# Patient Record
Sex: Female | Born: 1965 | Race: Black or African American | Hispanic: No | Marital: Single | Smoking: Former smoker
Health system: Southern US, Community
[De-identification: ages and names within clinical notes are randomized; demographics above are authoritative.]

## PROBLEM LIST (undated history)

## (undated) DIAGNOSIS — E079 Disorder of thyroid, unspecified: Secondary | ICD-10-CM

## (undated) DIAGNOSIS — C801 Malignant (primary) neoplasm, unspecified: Secondary | ICD-10-CM

## (undated) HISTORY — DX: Malignant (primary) neoplasm, unspecified: C80.1

## (undated) HISTORY — DX: Disorder of thyroid, unspecified: E07.9

## (undated) HISTORY — PX: THYROIDECTOMY: SHX17

## (undated) HISTORY — PX: OTHER SURGICAL HISTORY: SHX169

---

## 2011-05-26 ENCOUNTER — Ambulatory Visit: Payer: Self-pay | Admitting: Internal Medicine

## 2011-07-07 ENCOUNTER — Ambulatory Visit: Payer: Self-pay | Admitting: Internal Medicine

## 2011-08-14 ENCOUNTER — Ambulatory Visit (INDEPENDENT_AMBULATORY_CARE_PROVIDER_SITE_OTHER): Payer: BC Managed Care – PPO | Admitting: Internal Medicine

## 2011-08-14 ENCOUNTER — Encounter: Payer: Self-pay | Admitting: Internal Medicine

## 2011-08-14 ENCOUNTER — Other Ambulatory Visit (INDEPENDENT_AMBULATORY_CARE_PROVIDER_SITE_OTHER): Payer: BC Managed Care – PPO

## 2011-08-14 DIAGNOSIS — C801 Malignant (primary) neoplasm, unspecified: Secondary | ICD-10-CM

## 2011-08-14 DIAGNOSIS — Z0000 Encounter for general adult medical examination without abnormal findings: Secondary | ICD-10-CM

## 2011-08-14 DIAGNOSIS — C73 Malignant neoplasm of thyroid gland: Secondary | ICD-10-CM | POA: Insufficient documentation

## 2011-08-14 LAB — CBC WITH DIFFERENTIAL/PLATELET
Basophils Absolute: 0 10*3/uL (ref 0.0–0.1)
Basophils Relative: 0.7 % (ref 0.0–3.0)
Eosinophils Relative: 1.5 % (ref 0.0–5.0)
HCT: 39 % (ref 36.0–46.0)
Hemoglobin: 12.9 g/dL (ref 12.0–15.0)
Lymphocytes Relative: 22 % (ref 12.0–46.0)
Lymphs Abs: 1 10*3/uL (ref 0.7–4.0)
Monocytes Relative: 7.3 % (ref 3.0–12.0)
Neutro Abs: 3.1 10*3/uL (ref 1.4–7.7)
RBC: 4.02 Mil/uL (ref 3.87–5.11)
RDW: 14.5 % (ref 11.5–14.6)
WBC: 4.5 10*3/uL (ref 4.5–10.5)

## 2011-08-14 LAB — LIPID PANEL
Cholesterol: 198 mg/dL (ref 0–200)
HDL: 62 mg/dL (ref >39.00)
Total CHOL/HDL Ratio: 3
Triglycerides: 103 mg/dL (ref 0.0–149.0)

## 2011-08-14 LAB — COMPREHENSIVE METABOLIC PANEL
ALT: 12 U/L (ref 0–35)
BUN: 11 mg/dL (ref 6–23)
CO2: 25 mEq/L (ref 19–32)
Calcium: 9.1 mg/dL (ref 8.4–10.5)
Chloride: 107 mEq/L (ref 96–112)
Creatinine, Ser: 1 mg/dL (ref 0.4–1.2)
GFR: 63.45 mL/min (ref >60.00)
Total Bilirubin: 0.7 mg/dL (ref 0.3–1.2)

## 2011-08-14 MED ORDER — LEVOTHYROXINE SODIUM 175 MCG PO TABS: 175.0000 ug | ORAL_TABLET | Freq: Every day | ORAL | Status: DC

## 2011-08-14 NOTE — Progress Notes (Signed)
  Subjective:    Patient ID: Shelly Baldwin, female    DOB: Jun 29, 1966, 45 y.o.   MRN: [id number]  HPI Shelly Baldwin presents to establish for continuity care. She has moved to [place] from [place]. Her concern today is to have a refill on her thyroid replacement - s/p thyroidectomy for malignancy greater than 4 years ago. She is also interested in follow-up screening re: r/o recurrence. She has been feeling well. She has joined [employer].  Past Medical History  Diagnosis Date  . Cancer     undifferentiated thryoid  . Routine general medical examination at a health care facility    Past Surgical History  Procedure Date  . Thyroidectomy     2005  . G0p0    Family History  Problem Relation Age of Onset  . Hypertension Father   . Diabetes Father   . Hypothyroidism Father   . Cancer Father     prostate   History   Social History  . Marital Status: Single    Spouse Name: N/A    Number of Children: 0  . Years of Education: 14   Occupational History  . technical support    Social History Main Topics  . Smoking status: Former Smoker    Quit date: 08/13/1986  . Smokeless tobacco: Never Used  . Alcohol Use: No  . Drug Use: No  . Sexually Active: Not on file   Other Topics Concern  . Not on file   Social History Narrative   HSG, [employer] in [place], matriculating [employer] (Sept '12). Single. Work - Biochemist, clinical at [employer]. \Do you exercise at least 3 times a week- twice a week with walking. Is there a smoke alarm in your house- yes. Are there guns and firearms in your household- no . Have you experienced physical abuse-noUsual # of hours of sleep per night 6-7 hours per nightNumber of people living at your residence- Pt lives alone        Review of Systems Review of Systems  Constitutional:  Negative for fever, chills, activity change and unexpected weight change.  HEENT:  Negative for hearing loss, ear pain, congestion, neck stiffness and postnasal drip.  Negative for sore throat or swallowing problems. Negative for dental complaints.   Eyes: Negative for vision loss or change in visual acuity.  Respiratory: Negative for chest tightness and wheezing.   Cardiovascular: Negative for chest pain, rare palpitation. No decreased exercise tolerance Gastrointestinal: No change in bowel habit. No bloating or gas. No reflux or indigestion Genitourinary: Negative for urgency, frequency, flank pain and difficulty urinating.  Musculoskeletal: Negative for myalgias, back pain, arthralgias and gait problem.  Neurological: Negative for dizziness, tremors, weakness and headaches.  Hematological: Negative for adenopathy.  Psychiatric/Behavioral: Negative for behavioral problems and dysphoric mood.       Objective:   Physical Exam Vitals noted - stable BP Gen'l-overweight Shelly Baldwin woman in no distress HEENT - [place]/AT, C&S clear, PERRLA, EOMI, EACs/TMs normal Neck - anterior surgical scar, supple, no palpable thyroid Nodes - negative submandibular, cervical or supraclavicular area Pulm - normal respirations w/o abnormal sounds Cor - 2+ radial pulses, RRR w/o murmurs or gallops Extremities - no deformity, MAE Neuro - A&O x 3, CN II=XII normal, reflexes normal w/o hyperreflexia, prolonged relaxation phase or clonus Skin - normal texture and turgor.       Assessment & Plan:

## 2011-08-16 NOTE — Assessment & Plan Note (Signed)
Patient is doing well.  Plan- thyroid cancer follow-up with TSH, FT4, thyroglobulin level and U/S neck.

## 2011-08-16 NOTE — Assessment & Plan Note (Signed)
Pt with no acute medical problems. Limited physical exam is normal. She has an appointment with gynecology for routine well woman exam. Labs are pending.  In summary - a very nice woman who has established for on-going continuity care. She is oriented to the processes and services of the practice. She will be notified as to lab results. Her records are in route to verify immunization status. She is asked to return in the near future for follow-up exam.

## 2011-08-17 ENCOUNTER — Encounter: Payer: Self-pay | Admitting: Internal Medicine

## 2011-08-17 LAB — THYROGLOBULIN ANTIBODY: Thyroglobulin: <0.2 ng/mL (ref 0.0–55.0)

## 2011-08-19 ENCOUNTER — Telehealth: Payer: Self-pay | Admitting: Internal Medicine

## 2011-08-19 NOTE — Telephone Encounter (Signed)
Re: screening for recurrent thyroid cancer - thyroglobulin normal range. No problem!!

## 2011-08-20 NOTE — Telephone Encounter (Signed)
Informed pt .

## 2011-08-21 ENCOUNTER — Telehealth: Payer: Self-pay | Admitting: *Deleted

## 2011-08-21 NOTE — Telephone Encounter (Signed)
Pt is req a call back, she has questions about cholesterol results.

## 2011-08-26 NOTE — Telephone Encounter (Signed)
Informed pt .

## 2011-08-26 NOTE — Telephone Encounter (Signed)
Patient was sent a letter with lab results. LDL was 115 which is better than goal of 130 or less. For any discussion we can do at next ov or schedule an ov

## 2011-08-27 ENCOUNTER — Other Ambulatory Visit: Payer: BC Managed Care – PPO

## 2011-09-01 ENCOUNTER — Other Ambulatory Visit (HOSPITAL_COMMUNITY)
Admission: RE | Admit: 2011-09-01 | Discharge: 2011-09-01 | Disposition: A | Discharge status: Home / Self Care | Payer: BC Managed Care – PPO | Source: Ambulatory Visit | Attending: Obstetrics and Gynecology | Admitting: Obstetrics and Gynecology

## 2011-09-01 ENCOUNTER — Other Ambulatory Visit: Payer: Self-pay | Admitting: Nurse Practitioner

## 2011-09-01 DIAGNOSIS — N760 Acute vaginitis: Secondary | ICD-10-CM | POA: Insufficient documentation

## 2011-09-01 DIAGNOSIS — Z1231 Encounter for screening mammogram for malignant neoplasm of breast: Secondary | ICD-10-CM

## 2011-09-01 DIAGNOSIS — Z01419 Encounter for gynecological examination (general) (routine) without abnormal findings: Secondary | ICD-10-CM | POA: Insufficient documentation

## 2011-09-16 ENCOUNTER — Other Ambulatory Visit: Payer: BC Managed Care – PPO

## 2011-09-21 ENCOUNTER — Ambulatory Visit
Admission: RE | Admit: 2011-09-21 | Discharge: 2011-09-21 | Disposition: A | Discharge status: Home / Self Care | Payer: BC Managed Care – PPO | Source: Ambulatory Visit | Attending: Nurse Practitioner | Admitting: Nurse Practitioner

## 2011-09-21 DIAGNOSIS — Z1231 Encounter for screening mammogram for malignant neoplasm of breast: Secondary | ICD-10-CM

## 2011-09-22 ENCOUNTER — Other Ambulatory Visit: Payer: Self-pay | Admitting: Nurse Practitioner

## 2011-09-22 DIAGNOSIS — R928 Other abnormal and inconclusive findings on diagnostic imaging of breast: Secondary | ICD-10-CM

## 2011-09-25 ENCOUNTER — Ambulatory Visit: Payer: BC Managed Care – PPO | Admitting: Internal Medicine

## 2011-09-28 ENCOUNTER — Telehealth: Payer: Self-pay | Admitting: Internal Medicine

## 2011-09-28 NOTE — Telephone Encounter (Signed)
Received 4 pages from [facility]; forwarded to Dr. Debby Bud for review. 09/28/11-ar

## 2011-10-13 ENCOUNTER — Ambulatory Visit
Admission: RE | Admit: 2011-10-13 | Discharge: 2011-10-13 | Disposition: A | Discharge status: Home / Self Care | Payer: BC Managed Care – PPO | Source: Ambulatory Visit | Attending: Nurse Practitioner | Admitting: Nurse Practitioner

## 2011-10-13 DIAGNOSIS — R928 Other abnormal and inconclusive findings on diagnostic imaging of breast: Secondary | ICD-10-CM

## 2011-11-11 ENCOUNTER — Encounter: Payer: Self-pay | Admitting: *Deleted

## 2011-11-11 ENCOUNTER — Ambulatory Visit (INDEPENDENT_AMBULATORY_CARE_PROVIDER_SITE_OTHER): Payer: BC Managed Care – PPO | Admitting: Internal Medicine

## 2011-11-11 ENCOUNTER — Ambulatory Visit (INDEPENDENT_AMBULATORY_CARE_PROVIDER_SITE_OTHER)
Admission: RE | Admit: 2011-11-11 | Discharge: 2011-11-11 | Disposition: A | Discharge status: Home / Self Care | Payer: BC Managed Care – PPO | Source: Ambulatory Visit | Attending: Internal Medicine | Admitting: Internal Medicine

## 2011-11-11 ENCOUNTER — Encounter: Payer: Self-pay | Admitting: Internal Medicine

## 2011-11-11 VITALS — BP 120/92 | HR 72 | Temp 99.1°F

## 2011-11-11 DIAGNOSIS — M25529 Pain in unspecified elbow: Secondary | ICD-10-CM

## 2011-11-11 DIAGNOSIS — M25521 Pain in right elbow: Secondary | ICD-10-CM

## 2011-11-11 MED ORDER — HYDROCODONE-ACETAMINOPHEN 5-500 MG PO TABS: 1.0000 | ORAL_TABLET | Freq: Three times a day (TID) | ORAL | Status: AC | PRN

## 2011-11-11 NOTE — Progress Notes (Signed)
  Subjective:    Patient ID: Shelly Baldwin, female    DOB: 1966/03/21, 45 y.o.   MRN: [id number]  HPI  Complains of right elbow pain. Precipitated by accidental fall onto right elbow this morning when slipped in the rain. Onset of trauma less than 6 hours ago Symptoms associated with swelling of her elbow and 8/10 pain Pain unimproved following 400 mg of ibuprofen Pain worse with effort and extension or flexion of elbow -prefers to hold at 90 bend Pain located on medial side of elbow No other injuries sustained during fall, no loss of consciousness  Past medical history reviewed  Review of Systems  Musculoskeletal: Positive for joint swelling (r elbow area). Negative for back pain.  Neurological: Negative for dizziness, syncope and light-headedness.       Objective:   Physical Exam BP 120/92  Pulse 72  Temp(Src) 99.1 F (37.3 C) (Oral)  SpO2 99% General: Uncomfortable due to elbow pain but no acute distress MSkel - diffuse swelling medial greater than lateral side around elbow - tender to palpation near medial epicondyle region. Limited flexion or extension holding elbow at 90 bend, normal pronation and supination of right hand Skin: Mild abrasion over right medial side of elbow with slight bruising      Assessment & Plan:   R elbow pain - precipitated by trauma this AM (<6 hours ago) Significant pain with flexion/extension at medial epicondyle but supination/pronation ok Check xray rule out fracture Ice and ace wrap, continue ibuprofen and use vicodin as needed for severe pain Work note next 72h provided - may need longer work excuse or ortho refer depending on xray results

## 2011-11-11 NOTE — Patient Instructions (Signed)
It was good to see you today. Xray elbow ordered today. Your results will be called to you after review (48-72hours after test completion). If any changes need to be made, you will be notified at that time. Continue ibuprofen and use voicodin for sever pain symptoms as needed next few days Place ice pack (or bag of frazen peas) to elbow 3-4x/day while awake for 10-43min each time over next 72h, then as needed Work note for next 72hours provided as requested

## 2012-07-26 ENCOUNTER — Ambulatory Visit: Payer: BC Managed Care – PPO | Admitting: Internal Medicine

## 2012-08-04 ENCOUNTER — Other Ambulatory Visit (INDEPENDENT_AMBULATORY_CARE_PROVIDER_SITE_OTHER): Payer: BC Managed Care – PPO

## 2012-08-04 ENCOUNTER — Ambulatory Visit (INDEPENDENT_AMBULATORY_CARE_PROVIDER_SITE_OTHER): Payer: BC Managed Care – PPO | Admitting: Internal Medicine

## 2012-08-04 ENCOUNTER — Encounter: Payer: Self-pay | Admitting: Internal Medicine

## 2012-08-04 VITALS — BP 122/80 | HR 61 | Temp 99.2°F | Resp 16 | Wt 242.0 lb

## 2012-08-04 DIAGNOSIS — Z13220 Encounter for screening for lipoid disorders: Secondary | ICD-10-CM

## 2012-08-04 DIAGNOSIS — E7800 Pure hypercholesterolemia, unspecified: Secondary | ICD-10-CM

## 2012-08-04 DIAGNOSIS — E032 Hypothyroidism due to medicaments and other exogenous substances: Secondary | ICD-10-CM

## 2012-08-04 DIAGNOSIS — Z0000 Encounter for general adult medical examination without abnormal findings: Secondary | ICD-10-CM

## 2012-08-04 LAB — COMPREHENSIVE METABOLIC PANEL
Albumin: 4.4 g/dL (ref 3.5–5.2)
CO2: 25 mEq/L (ref 19–32)
Calcium: 9.4 mg/dL (ref 8.4–10.5)
GFR: 60.38 mL/min (ref >60.00)
Glucose, Bld: 86 mg/dL (ref 70–99)
Potassium: 4.4 mEq/L (ref 3.5–5.1)
Sodium: 138 mEq/L (ref 135–145)
Total Protein: 7.9 g/dL (ref 6.0–8.3)

## 2012-08-04 LAB — CBC WITH DIFFERENTIAL/PLATELET
Basophils Relative: 0.6 % (ref 0.0–3.0)
Eosinophils Absolute: 0.1 10*3/uL (ref 0.0–0.7)
MCHC: 32.8 g/dL (ref 30.0–36.0)
MCV: 96.9 fl (ref 78.0–100.0)
Monocytes Absolute: 0.5 10*3/uL (ref 0.1–1.0)
Neutro Abs: 3.8 10*3/uL (ref 1.4–7.7)
Neutrophils Relative %: 69.3 % (ref 43.0–77.0)
RBC: 4.17 Mil/uL (ref 3.87–5.11)
RDW: 14.1 % (ref 11.5–14.6)

## 2012-08-04 LAB — LIPID PANEL: Triglycerides: 99 mg/dL (ref 0.0–149.0)

## 2012-08-04 LAB — TSH: TSH: 0.21 u[IU]/mL — ABNORMAL LOW (ref 0.35–5.50)

## 2012-08-04 NOTE — Progress Notes (Signed)
Subjective:     Patient ID: Shelly Baldwin, female   DOB: Nov 26, 1965, 46 y.o.   MRN: [id number]  HPI Comments: Shelly Baldwin is a 46 yo female with a history of thyroid cancer treated with thyroidectomy who has come in today for an annual exam. She reports that she has been doing well, but experienced episodes of heart palpitations not accompanied by chest pain, lightheadedness, or SOB. She has 2 to 3 palpitations in each episode that lasts less than a second. She reports that began in August and believes it was due to stress. Her last episode was two weeks ago. She has been experiencing some congestion as well since last week. She does not feel feverish and is unsure if she had a fever last week. She reports that several of her coworkers have been sick recently.  Shelly Baldwin wanted to talk about weight loss. She reports that her diet consists of fruits, beans, lean meat and fish, and complex carbohydrates. She does state that she does not eat many vegetables. She reports that she has a sweet tooth and eats chocolate and ice cream often. She also drinks sweet tea once or twice a month and drinks ginger ale soda daily. She rarely eats read meat or pork. She exercises two times a week by walking around her neighborhood and takes the stairs at work. She also occasionally attends zumba class.   She reports that she had her Tdap vaccine less than 10 years ago and sees a gynecologist for her Pap smears and breast exams. She has not had her influenza vaccine this year.  Past Medical History  Diagnosis Date  . Cancer     undifferentiated thryoid   Past Surgical History  Procedure Date  . Thyroidectomy     2005  . G0p0    Family History  Problem Relation Age of Onset  . Hypertension Father   . Diabetes Father   . Hypothyroidism Father   . Cancer Father     prostate   History   Social History  . Marital Status: Single    Spouse Name: N/A    Number of Children: 0  . Years of Education: 14    Occupational History  . technical support    Social History Main Topics  . Smoking status: Former Smoker    Quit date: 08/13/1986  . Smokeless tobacco: Never Used  . Alcohol Use: No  . Drug Use: No  . Sexually Active: Not on file   Other Topics Concern  . Not on file   Social History Narrative   HSG, [employer] in [place], matriculating [employer] (Sept '12). Single. Work - Biochemist, clinical at [employer]. \Do you exercise at least 3 times a week- twice a week with walking. Is there a smoke alarm in your house- yes. Are there guns and firearms in your household- no . Have you experienced physical abuse-noUsual # of hours of sleep per night 6-7 hours per nightNumber of people living at your residence- Pt lives alone    Current Outpatient Prescriptions on File Prior to Visit  Medication Sig Dispense Refill  . levothyroxine (SYNTHROID, LEVOTHROID) 175 MCG tablet Take 1 tablet (175 mcg total) by mouth daily.  30 tablet  11     Review of Systems  All other systems reviewed and are negative.       Objective:   Physical Exam  Nursing note and vitals reviewed. Constitutional: She is oriented to person, place, and time. She appears well-nourished. No distress.  HENT:  Head: Normocephalic and atraumatic.  Right Ear: External ear normal.  Left Ear: External ear normal.  Nose: Nose normal.  Mouth/Throat: Oropharynx is clear and moist. No oropharyngeal exudate.       Nares clear of debris. External ear canals clear of debris. TM visualized bl and appear normal.  Eyes: Right eye exhibits no discharge. Left eye exhibits no discharge.  Neck: Normal range of motion. Neck supple. No thyromegaly present.       Surgical scar consistent with thyroidectomy.  Cardiovascular: Normal rate, regular rhythm and normal heart sounds.  Exam reveals no gallop and no friction rub.   No murmur heard.      No carotid bruits. 2+ radial pulses bl.  Pulmonary/Chest: Effort normal and breath sounds normal.  No respiratory distress. She has no wheezes. She has no rales.  Abdominal: Soft. Bowel sounds are normal. She exhibits no distension and no mass. There is no tenderness. There is no guarding.  Lymphadenopathy:    She has no cervical adenopathy.  Neurological: She is alert and oriented to person, place, and time.  Skin: She is not diaphoretic.  Psychiatric: She has a normal mood and affect. Her behavior is normal. Judgment and thought content normal.   Filed Vitals:   08/04/12 0934  BP: 122/80  Pulse: 61  Temp: 99.2 F (37.3 C)  Resp: 16   Lab Results  Component Value Date   WBC 5.5 08/04/2012   HGB 13.3 08/04/2012   HCT 40.4 08/04/2012   PLT 314.0 08/04/2012   GLUCOSE 86 08/04/2012   CHOL 229* 08/04/2012   TRIG 99.0 08/04/2012   HDL 55.80 08/04/2012   LDLDIRECT 158.4 08/04/2012   LDLCALC 115* 08/14/2011   ALT 13 08/04/2012   AST 17 08/04/2012   NA 138 08/04/2012   K 4.4 08/04/2012   CL 104 08/04/2012   CREATININE 1.0 08/04/2012   BUN 12 08/04/2012   CO2 25 08/04/2012   TSH 0.21* 08/04/2012        Assessment & Plan:     1. Annual Exam - No concerning symptoms or signs. Her heart palpitations are benign because she has no other cardiac symptoms or signs. She may be recovering from a viral infection which explains her temperature and congestion. She was concerned about her weight and risk for HTN. She has hypothyroidism from iatrogenic causes.  PLAN - counseled patient on good dieting and exercise. Her gynecologist will manage   mammograms and pap smears. We will obtain a lipid panel and TSH today.  Attending note: patient interviewed and a limited exam was performed. Agree with the findings and recommendations above per Mr. Kai Levins, MS III  Addendum: labs return with an elevated LDL cholesterol, above goal of 130 or less Plan - life-style management with weight management as noted, especially making low fat choices, and repeat lab work in  6 months.

## 2012-08-04 NOTE — Patient Instructions (Signed)
1. Dieting - Make smart food choices which means reading and comparing the nutrition labels and keeping an eye on added sugar. Foods like sweet tea and soda have very large amounts of added sugar and should be cut down or removed from your diet. There is a lot of information on reading nutrition lables online. The American Heart Associate is a Chief Technology Officer for healthy food choices. Manage your portion size. You can do this by using your hand. Your portion size for meats should be about the size of your palm. Everything else should be able to fit into the cup of your hand. Salad is the exception because it is loose leafy greens. Exercising regularly is also very important. We recommend 30 minutes of exercise 3 times a week that gets your heart rate up. It can be anything that gets your heart rate up. You can break this 30 minutes up into smaller pieces. I attached articles about reading food labels and calorie counting for you to read at your leisure.  2. Claritin D Use - You should not use Claritin D. It contains other things which you do not need. You can use pseudoephedrine by itself, a decongestant, which can be obtained by asking your pharmacist.  Reading Food Labels Foods that are in packaging or containers will often have a Nutrition Facts panel on its side or back. The Nutrition Facts panel provides the nutritional value of the food. This information is helpful when determining healthy food choices. By reading food labels, you will find out the serving size of a food and how many servings the package has. You will also find information about the calorie and fat content, as well as the amount of carbohydrate, and vitamins and minerals. Food labels are a great reference for you to use to learn about the food you are eating. BREAKING DOWN THE FOOD LABEL Serving Size: The serving size is an amount of food and is often listed in cups, weight, or units. All of the nutrition information about the food is  listed according to the serving size. If you double the serving size, you must double the amounts on the label.   Servings per [employer] or Package: The number of servings in the container is listed here.   Calories: The number of calories in one serving is listed here. Everyone needs a different amount of calories each day. Having calories listed on the label is helpful information for people who would like to keep track of the number of calories they eat to stay at a healthy weight. Calories from Fat:  The number of calories that come from fat in one serving are listed here.   NUTRIENTS THAT ARE LISTED ON THE FOOD LABEL.    Percent Daily Value: The food label helps you know if you are getting the amounts of nutrients you need each day by the percent daily value. It tells you how much of your daily values of each nutrient are provided by one serving of the food. The percent daily value is based on a 2000 calorie diet. You may need more or less than 2000 calories each day.   Total Fat: The total amount of fat in one serving is listed here. The number is shown in grams (g). This information is important for people who want to keep track of the amount of fat in their diet. Foods with high amounts of fat usually have higher calories and may lead to weight gain.   Saturated Fat:  The amount of saturated fat in one serving is listed here. It is also shown in grams. Saturated fat is one type of fat that is found in food. It increases the amount of blood cholesterol more than other types of fat found in food. So saturated fat should be limited in the diet to less than 7 percent of total calories each day for most people. This means that if a person eats 2000 calories each day, they should eat less than 140 calories from saturated fat.   Trans Fat: The amount of trans fat in one serving is listed here. It is also shown in grams. Trans fat is another type of fat that is found in food. It should also be limited to  less than 2 grams per day because it increases blood cholesterol.   Cholesterol: The amount of cholesterol in one serving is listed here. It is shown in milligrams (mg). Cholesterol should be limited to no more than 200 mg each day.   Sodium: The amount of sodium in one serving is listed here. It is shown in milligrams. American Heart Association recommends that sodium should be limited to 1500mg /day. This recommended level of sodium was recently lowered from 2400mg /day.   Total Carbohydrate: The amount of carbohydrate in one serving is listed here. It is shown in grams. This information is important for people with diabetes because they need to manage the amount of carbohydrate they eat. Carbohydrate changes the amount of glucose or sugar in the blood and diabetics do not want that amount to be too high or too low.   Dietary Fiber:  The amount of dietary fiber in one serving is listed here. It is shown in grams. Fiber is a type of carbohydrate. Most people should eat 25 grams of dietary fiber each day.   Sugars: The amount of sugar in one serving is listed here. It is shown in grams. Sugars are also a type of carbohydrate. This value includes both naturally occurring sugars from fruit and milk and added sugars such as honey or table sugar.   Protein: The amount of protein in one serving is listed here. It is shown in grams.   Vitamins and Minerals: Food labels list vitamin A, vitamin C, calcium and iron. They are all shown as a percent of the daily need one serving of the food provides. For example, if 15% is listed next to iron it means that one serving of that food will give you 15% of the total amount of iron you need for one day.   Calories per Gram: Some food labels will list the number of calories that are in each gram or protein, carbohydrate and fat. Protein has four calories per gram, carbohydrate has four calories per gram, and fat has 9 calories per gram.   Ingredients: Food labels  will list each ingredient in the food. The first ingredient listed is the ingredient that the food has the most of. The ingredients are listed in the order of their amount from highest to lowest.   Contains: Food labels may also include this portion of the label as a food allergen warning. Listed here are ingredients that can cause allergies in some people. Examples of ingredients that are listed are wheat, dairy, eggs, soy and nuts. If a person knows that are allergic to one of these ingredients they will know not to eat the food in the container.  Information from www.eatright.org, Foodwise Nutritional Analysis Database, ADA Nutrition Care Manual. Document Released:  11/02/2005 Document Revised: 10/22/2011 Document Reviewed: 03/18/2009 [facility] Patient Information 2012 [place], [place].  Calorie Counting Diet A calorie counting diet requires you to eat the number of calories that are right for you in a day. Calories are the measurement of how much energy you get from the food you eat. Eating the right amount of calories is important for staying at a healthy weight. If you eat too many calories, your body will store them as fat and you may gain weight. If you eat too few calories, you may lose weight. Counting the number of calories you eat during a day will help you know if you are eating the right amount. A Registered Dietitian can determine how many calories you need in a day. The amount of calories needed varies from person to person. If your goal is to lose weight, you will need to eat fewer calories. Losing weight can benefit you if you are overweight or have health problems such as heart disease, high blood pressure, or diabetes. If your goal is to gain weight, you will need to eat more calories. Gaining weight may be necessary if you have a certain health problem that causes your body to need more energy. TIPS Whether you are increasing or decreasing the number of calories you eat during a day, it  may be hard to get used to changes in what you eat and drink. The following are tips to help you keep track of the number of calories you eat.  Measure foods at home with measuring cups. This helps you know the amount of food and number of calories you are eating.   Restaurants often serve food in amounts that are larger than 1 serving. While eating out, estimate how many servings of a food you are given. For example, a serving of cooked rice is  cup or about the size of half of a fist. Knowing serving sizes will help you be aware of how much food you are eating at restaurants.   Ask for smaller portion sizes or child-size portions at restaurants.   Plan to eat half of a meal at a restaurant. Take the rest home or share the other half with a friend.   Read the Nutrition Facts panel on food labels for calorie content and serving size. You can find out how many servings are in a package, the size of a serving, and the number of calories each serving has.   For example, a package might contain 3 cookies. The Nutrition Facts panel on that package says that 1 serving is 1 cookie. Below that, it will say there are 3 servings in the container. The calories section of the Nutrition Facts label says there are 90 calories. This means there are 90 calories in 1 cookie (1 serving). If you eat 1 cookie you have eaten 90 calories. If you eat all 3 cookies, you have eaten 270 calories (3 servings x 90 calories = 270 calories).  The list below tells you how big or small some common portion sizes are.  1 oz.........4 stacked dice.   3 oz........Marland KitchenDeck of cards.   1 tsp.......Marland KitchenTip of little finger.   1 tbs......Marland KitchenMarland KitchenThumb.   2 tbs.......Marland KitchenGolf ball.    cup......Marland KitchenHalf of a fist.   1 cup.......Marland KitchenA fist.  KEEP A FOOD LOG Write down every food item you eat, the amount you eat, and the number of calories in each food you eat during the day. At the end of the day, you can add up  the total number of calories you  have eaten. It may help to keep a list like the one below. Find out the calorie information by reading the Nutrition Facts panel on food labels. Breakfast  Bran cereal (1 cup, 110 calories).   Fat-free milk ( cup, 45 calories).  Snack  Apple (1 medium, 80 calories).  Lunch  Spinach (1 cup, 20 calories).   Tomato ( medium, 20 calories).   Chicken breast strips (3 oz, 165 calories).   Shredded cheddar cheese ( cup, 110 calories).   Light [place] dressing (2 tbs, 60 calories).   Whole-wheat bread (1 slice, 80 calories).   Tub margarine (1 tsp, 35 calories).   Vegetable soup (1 cup, 160 calories).  Dinner  Pork chop (3 oz, 190 calories).   Brown rice (1 cup, 215 calories).   Steamed broccoli ( cup, 20 calories).   Strawberries (1  cup, 65 calories).   Whipped cream (1 tbs, 50 calories).  Daily Calorie Total: 1425 Document Released: 11/02/2005 Document Revised: 10/22/2011 Document Reviewed: 04/29/2007 [facility] Patient Information 2012 [place], [place].

## 2012-08-05 NOTE — Assessment & Plan Note (Signed)
Lab Results  Component Value Date   TSH 0.21* 08/04/2012   Slightly low reading  Plan- continue present dose of levothyroxine.

## 2012-08-05 NOTE — Assessment & Plan Note (Signed)
Interval medical history is unremarkable. She has not had any major illness, injury or surgery. Her lab work reveals a borderline TSH but not a point requiring a change in medication. Her lipids reveal elevated LDL cholesterol - will approach with life-style management.  In summary - a nice woman who appears medically stable. She will return for lab work in 6 months.

## 2012-09-13 ENCOUNTER — Other Ambulatory Visit: Payer: Self-pay | Admitting: Internal Medicine

## 2013-05-01 ENCOUNTER — Ambulatory Visit: Payer: BC Managed Care – PPO | Admitting: Internal Medicine

## 2013-05-02 ENCOUNTER — Encounter: Payer: Self-pay | Admitting: Internal Medicine

## 2013-05-02 ENCOUNTER — Ambulatory Visit (INDEPENDENT_AMBULATORY_CARE_PROVIDER_SITE_OTHER): Payer: BC Managed Care – PPO | Admitting: Internal Medicine

## 2013-05-02 VITALS — BP 140/92 | HR 76 | Temp 99.3°F | Resp 16 | Wt 240.0 lb

## 2013-05-02 DIAGNOSIS — J069 Acute upper respiratory infection, unspecified: Secondary | ICD-10-CM | POA: Insufficient documentation

## 2013-05-02 MED ORDER — LORATADINE 10 MG PO TABS: 10.0000 mg | ORAL_TABLET | Freq: Every day | ORAL | Status: DC

## 2013-05-02 MED ORDER — FLUTICASONE PROPIONATE 50 MCG/ACT NA SUSP: 2.0000 | Freq: Every day | NASAL | Status: DC

## 2013-05-02 MED ORDER — AZITHROMYCIN 250 MG PO TABS: ORAL_TABLET | ORAL | Status: DC

## 2013-05-02 NOTE — Assessment & Plan Note (Addendum)
See meds - Z pac, Flonase, Loratidine

## 2013-05-02 NOTE — Progress Notes (Signed)
Subjective:     Patient ID: Shelly Baldwin, female   DOB: 1966-05-30, 47 y.o.   MRN: [id number]  Sinusitis This is a new problem. The current episode started in the past 7 days. The problem has been gradually worsening since onset. There has been no fever. She is experiencing no pain. Associated symptoms include congestion and swollen glands. Pertinent negatives include no sneezing. Past treatments include saline sprays and oral decongestants. The treatment provided mild relief.     Review of Systems  HENT: Positive for congestion. Negative for sneezing.        Objective:   Physical Exam  Constitutional: No distress.  HENT:  Right Ear: External ear normal.  Left Ear: External ear normal.  Mouth/Throat: Oropharynx is clear and moist.  Swollen nasal mucosa  Cardiovascular: Normal rate.   No murmur heard. Pulmonary/Chest: No respiratory distress. She has no wheezes. She has no rales.  Abdominal: There is no guarding.  Musculoskeletal: She exhibits no edema.       Assessment:         Plan:

## 2013-05-03 ENCOUNTER — Other Ambulatory Visit: Payer: Self-pay | Admitting: Internal Medicine

## 2013-05-13 ENCOUNTER — Other Ambulatory Visit: Payer: Self-pay | Admitting: Internal Medicine

## 2013-11-24 ENCOUNTER — Other Ambulatory Visit: Payer: Self-pay

## 2013-11-24 ENCOUNTER — Encounter: Payer: Self-pay | Admitting: Internal Medicine

## 2013-11-24 ENCOUNTER — Ambulatory Visit (INDEPENDENT_AMBULATORY_CARE_PROVIDER_SITE_OTHER): Payer: BC Managed Care – PPO | Admitting: Internal Medicine

## 2013-11-24 VITALS — BP 112/78 | HR 62 | Temp 98.5°F | Resp 12 | Ht 65.5 in | Wt 243.5 lb

## 2013-11-24 DIAGNOSIS — C73 Malignant neoplasm of thyroid gland: Secondary | ICD-10-CM

## 2013-11-24 DIAGNOSIS — E890 Postprocedural hypothyroidism: Secondary | ICD-10-CM

## 2013-11-24 DIAGNOSIS — Z1231 Encounter for screening mammogram for malignant neoplasm of breast: Secondary | ICD-10-CM

## 2013-11-24 LAB — T4, FREE: FREE T4: 0.7 ng/dL (ref 0.60–1.60)

## 2013-11-24 LAB — TSH: TSH: 4.06 u[IU]/mL (ref 0.35–5.50)

## 2013-11-24 NOTE — Patient Instructions (Signed)
Please return in 1 year. We will likely check an ultrasound of your neck then. Please stop at the lab. Please join MyChart >> I will send you results through there.

## 2013-11-24 NOTE — Progress Notes (Addendum)
Patient ID: Shelly Baldwin, female   DOB: 19-Nov-1965, 48 y.o.   MRN: [id number]   HPI  Shelly Baldwin is a 48 y.o.-year-old female, referred by her PCP, Dr. Linda Hedges, for management of h/o thyroid cancer and postsurgical hypothyroidism. She moved here from [place] x 3-4 years. Records pending.  Pt. has been found to have a goiter with neck compression sxs, then had Bx >> inconclusive >> total thyroidectomy >> dx with thyroid cancer in 2004, had surgery in 2005. She remembers having had RAI tx and a WBS. She had rTSH - stim WBS every year for 5 years >> no mets (up to 2010).  She then developed hypothyroidism; is on Synthroid (brand) 175 mcg, taken: - fasting - with water - separated by 1h from b'fast  - no calcium, iron, PPIs, multivitamins   I reviewed pt's thyroid tests: Lab Results  Component Value Date   TSH 0.21* 08/04/2012   TSH 0.34* 08/14/2011   FREET4 0.79 08/14/2011    08/14/2011: Thyroid Peroxidase Antibody <35.0 IU/mL  <10.0   Thyroglobulin Ab <40.0 U/mL  <20.0   Thyroglobulin 0.0 - 55.0 ng/mL  <0.2   Last thyroid U/S before she moved here.   Pt denies feeling nodules in neck, hoarseness, dysphagia/odynophagia, SOB with lying down.  Pt denies hypothyroid sxs: - cold intolerance - weight gain - fatigue - constipation - dry skin - hair falling - depression  She has + FH of thyroid disorders in: father, grandfather (goiter) and aunt. No FH of thyroid cancer.  No h/o radiation tx to head or neck.  I reviewed her chart and she also has a history of seasonal allergies.  ROS: Constitutional: no weight gain/loss, no fatigue, no subjective hyperthermia/hypothermia Eyes: no blurry vision, no xerophthalmia ENT: no sore throat, + nodules palpated in throat, no dysphagia/odynophagia, no hoarseness Cardiovascular: no CP/SOB/palpitations/leg swelling Respiratory: no cough/SOB Gastrointestinal: no N/V/D/C Musculoskeletal: no muscle/joint aches Skin: no rashes Neurological:  no tremors/numbness/tingling/dizziness Psychiatric: no depression/anxiety  Past Medical History  Diagnosis Date  . Cancer     undifferentiated thryoid   Past Surgical History  Procedure Laterality Date  . Thyroidectomy      2005  . G0p0     History   Social History  . Marital Status: Single    Spouse Name: N/A    Number of Children: 0  . Years of Education: 14   Occupational History  . technical support    Social History Main Topics  . Smoking status: Former Smoker    Quit date: 08/13/1986  . Smokeless tobacco: Never Used  . Alcohol Use: No  . Drug Use: No  . Sexual Activity: Not on file   Other Topics Concern  . Not on file   Social History Narrative   HSG, [employer] in [place], matriculating [employer] (Sept '12). Single. Work - Loss adjuster, chartered at [employer]. \Do you exercise at least 3 times a week- twice a week with walking. Is there a smoke alarm in your house- yes. Are there guns and firearms in your household- no . Have you experienced physical abuse-no         Usual # of hours of sleep per night 6-7 hours per night   Number of people living at your residence- Pt lives alone   Current Outpatient Prescriptions on File Prior to Visit  Medication Sig Dispense Refill  . SYNTHROID 175 MCG tablet take 1 tablet by mouth once daily  30 tablet  5  . fluticasone (FLONASE) 50 MCG/ACT nasal  spray Place 2 sprays into the nose daily.  16 g  3  . loratadine (CLARITIN) 10 MG tablet Take 1 tablet (10 mg total) by mouth daily.  30 tablet  1   No current facility-administered medications on file prior to visit.   No Known Allergies Family History  Problem Relation Age of Onset  . Hypertension Father   . Diabetes Father   . Hypothyroidism Father   . Cancer Father     prostate   PE: BP 112/78  Pulse 62  Temp(Src) 98.5 F (36.9 C) (Oral)  Resp 12  Ht 5' 5.5" (1.664 m)  Wt 243 lb 8 oz (110.451 kg)  BMI 39.89 kg/m2  SpO2 99% Wt Readings from Last 3 Encounters:   11/24/13 243 lb 8 oz (110.451 kg)  05/02/13 240 lb (108.863 kg)  08/04/12 242 lb (109.77 kg)   Constitutional: obese, in NAD Eyes: PERRLA, EOMI, no exophthalmos ENT: moist mucous membranes, no thyromegaly, no cervical lymphadenopathy, healed thyroid scar Cardiovascular: RRR, No MRG Respiratory: CTA B Gastrointestinal: abdomen soft, NT, ND, BS+ Musculoskeletal: no deformities, strength intact in all 4 Skin: moist, warm, no rashes Neurological: no tremor with outstretched hands, DTR normal in all 4  ASSESSMENT: 1. 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 Thyroid Cancer (stage 1 or 2) in remission  PLAN:  1. 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 She appears euthyroid. - We discussed about correct intake of levothyroxine, fasting, with water, separated by at least 30 minutes from breakfast, and separated by more than 4 hours from calcium, iron, multivitamins, acid reflux medications (PPIs). She is taking it correctly. - the TSH levels that I reviewed were at goal for [facility] pts (<LLN), but, 10 years after her total thyroidectomy, with an undetectable Tg (per he 2012 check), and with repeated negative WBS's, we can now relax the TSH suppression to LLN or a little above - She does not appear to have enlarged LNs or any palpable thyroid mass >> we will check a Thyr U/s only if Tg detectable - we'll check thyroid tests today: TSH, free T4, Tg, ATA - needs a refill of her Synthroid when labs back - If these are abnormal, she will need to return in 6-8 weeks for repeat labs - If these are normal, I will see her back in 1 year  Component     Latest Ref Rng 11/24/2013  TSH     0.35 - 5.50 uIU/mL 4.06  Free T4     0.60 - 1.60 ng/dL 0.70  Thyroglobulin     0.0 - 55.0 ng/mL <0.2  Thyroglobulin Ab     <40.0 U/mL 485.0 (H)   Advise pt to take Synthroid every day >> RTC in 1.5 mo for labs >> recheck Tg +  ATA ([facility]) then >> if still high ATA >> will check neck U/S.  03/25/2014 Component     Latest Ref Rng 03/22/2014  TSH     0.35 - 4.50 uIU/mL 0.01 (L)  Free T4     0.60 - 1.60 ng/dL 1.50  Antithyroglobulin Ab     0.0 - 0.9 IU/mL <1.0  THYROGLOBULIN, SERUM     1.5 - 38.5 ng/mL <0.1 (L)   Dear Shelly Baldwin, Both thyroglobulin (Tg) and the Tg antibodies (ATA) are undetectable, which is great!!! The TSH is too low >> let's decrease the dose of your Synthroid to 150 mcg (I will send this to your  pharmacy). Due to this change in dose, we need a repeat set of labs in ~6 weeks. Sincerely, Philemon Kingdom MD  From now on - need to send Tg + ATA to [place].

## 2013-11-27 LAB — THYROGLOBULIN LEVEL

## 2013-11-27 LAB — THYROGLOBULIN ANTIBODY: THYROGLOBULIN AB: 485 U/mL — AB (ref <40.0)

## 2013-12-13 ENCOUNTER — Encounter: Payer: Self-pay | Admitting: Internal Medicine

## 2013-12-13 ENCOUNTER — Ambulatory Visit (INDEPENDENT_AMBULATORY_CARE_PROVIDER_SITE_OTHER): Payer: BC Managed Care – PPO | Admitting: Internal Medicine

## 2013-12-13 VITALS — BP 130/90 | HR 62 | Temp 98.5°F | Wt 245.0 lb

## 2013-12-13 DIAGNOSIS — E66811 Obesity, class 1: Secondary | ICD-10-CM

## 2013-12-13 DIAGNOSIS — M25569 Pain in unspecified knee: Secondary | ICD-10-CM

## 2013-12-13 DIAGNOSIS — M25562 Pain in left knee: Secondary | ICD-10-CM

## 2013-12-13 DIAGNOSIS — E669 Obesity, unspecified: Secondary | ICD-10-CM

## 2013-12-13 NOTE — Progress Notes (Signed)
Pre visit review using our clinic review tool, if applicable. No additional management support is needed unless otherwise documented below in the visit note. 

## 2013-12-13 NOTE — Progress Notes (Signed)
   Subjective:    Patient ID: Shelly Baldwin, female    DOB: [birth year], 48 y.o.   MRN: [id number]  HPI Ms. Busick reports that a week ago she had the onset of pain in the left knee. It hurt to climb stairs and to bear weight. The pain is rated as mild and there was sense of stiffness associated with the discomfort.  By Thursday it was better but on Saturday she had recurrent discomfort but not as bad as Wednesday. There is a fullness behind the knee and perhaps a little swelling. By today's visit the problem is resolved. She does have some discomfort with first standing.   Past Medical History  Diagnosis Date  . Cancer     undifferentiated thryoid   Past Surgical History  Procedure Laterality Date  . Thyroidectomy      2005  . G0p0     Family History  Problem Relation Age of Onset  . Hypertension Father   . Diabetes Father   . Hypothyroidism Father   . Cancer Father     prostate   History   Social History  . Marital Status: Single    Spouse Name: N/A    Number of Children: 0  . Years of Education: 14   Occupational History  . technical support    Social History Main Topics  . Smoking status: Former Smoker    Quit date: 08/13/1986  . Smokeless tobacco: Never Used  . Alcohol Use: No  . Drug Use: No  . Sexual Activity: Not on file   Other Topics Concern  . Not on file   Social History Narrative   HSG, [employer] in [place], matriculating [employer] (Sept '12). Single. Work - Loss adjuster, chartered at [employer]. \Do you exercise at least 3 times a week- twice a week with walking. Is there a smoke alarm in your house- yes. Are there guns and firearms in your household- no . Have you experienced physical abuse-no         Usual # of hours of sleep per night 6-7 hours per night   Number of people living at your residence- Pt lives alone                      Current Outpatient Prescriptions on File Prior to Visit  Medication Sig Dispense Refill  . SYNTHROID 175 MCG  tablet take 1 tablet by mouth once daily  30 tablet  5   No current facility-administered medications on file prior to visit.      Review of Systems System review is negative for any constitutional, cardiac, pulmonary, GI or neuro symptoms or complaints other than as described in the HPI.     Objective:   Physical Exam Filed Vitals:   12/13/13 0825  BP: 130/90  Pulse: 62  Temp: 98.5 F (36.9 C)   Wt Readings from Last 3 Encounters:  12/13/13 245 lb (111.131 kg)  11/24/13 243 lb 8 oz (110.451 kg)  05/02/13 240 lb (108.863 kg)   Gen'l- obese woman in no distress Cor - 2+ radial, RRR Pulm - normal respirations MSK - left knee w/ normal ROM, no crepitus, no click, no effusion, no tenderness Neuro - A&O x 3       Assessment & Plan:  Knee pain, left - transient discomfort now resolved  Plan  weight loss

## 2013-12-13 NOTE — Patient Instructions (Signed)
Thanks for coming in to see me.  Knee pain - there is no damage to the knee, no arthritis. You had a transient inflammation of some kind that is now better. There is no need for any studies or treatment   Weight management - the most important factor in maintaining long term good health, joints and otherwise.  Plan Diet management: smart food choices, PORTION SIZE CONTROL, regular exercise. Goal - to loose 1-2 lbs.month. [employer] weight - 195 lbs (a four year project). Stick to it because the payoff is huge.

## 2013-12-15 DIAGNOSIS — E669 Obesity, unspecified: Secondary | ICD-10-CM | POA: Insufficient documentation

## 2013-12-15 NOTE — Assessment & Plan Note (Signed)
Discussed the health risks, long term of obesity, especially in regard to weight bearing joints.  Plan Diet management: smart food choices, PORTION SIZE CONTROL, regular exercise. Goal - to loose 1-2 lbs.month. [employer] weight - 195

## 2013-12-25 ENCOUNTER — Ambulatory Visit: Payer: BC Managed Care – PPO

## 2014-01-08 ENCOUNTER — Ambulatory Visit
Admission: RE | Admit: 2014-01-08 | Discharge: 2014-01-08 | Disposition: A | Discharge status: Home / Self Care | Payer: BC Managed Care – PPO | Source: Ambulatory Visit

## 2014-01-08 DIAGNOSIS — Z1231 Encounter for screening mammogram for malignant neoplasm of breast: Secondary | ICD-10-CM

## 2014-03-22 ENCOUNTER — Other Ambulatory Visit (INDEPENDENT_AMBULATORY_CARE_PROVIDER_SITE_OTHER): Payer: BC Managed Care – PPO

## 2014-03-22 DIAGNOSIS — C73 Malignant neoplasm of thyroid gland: Secondary | ICD-10-CM

## 2014-03-22 DIAGNOSIS — E890 Postprocedural hypothyroidism: Secondary | ICD-10-CM

## 2014-03-22 LAB — T4, FREE: Free T4: 1.5 ng/dL (ref 0.60–1.60)

## 2014-03-22 LAB — TSH: TSH: 0.01 u[IU]/mL — AB (ref 0.35–4.50)

## 2014-03-23 LAB — THYROGLOBULIN WITH ANTI-TG AB: Antithyroglobulin Ab: <1 IU/mL (ref 0.0–0.9)

## 2014-03-23 LAB — THYROGLOBULIN, SERUM

## 2014-03-25 MED ORDER — SYNTHROID 150 MCG PO TABS: 150.0000 ug | ORAL_TABLET | Freq: Every day | ORAL | Status: DC

## 2014-03-25 NOTE — Addendum Note (Signed)
Addended by: Philemon Kingdom on: 03/25/2014 09:34 AM   Modules accepted: Orders, Medications

## 2014-09-26 ENCOUNTER — Other Ambulatory Visit: Payer: BC Managed Care – PPO

## 2014-09-26 ENCOUNTER — Other Ambulatory Visit: Payer: Self-pay | Admitting: Internal Medicine

## 2014-09-26 DIAGNOSIS — E032 Hypothyroidism due to medicaments and other exogenous substances: Secondary | ICD-10-CM

## 2014-09-26 DIAGNOSIS — C801 Malignant (primary) neoplasm, unspecified: Secondary | ICD-10-CM

## 2014-09-29 ENCOUNTER — Encounter: Payer: Self-pay | Admitting: Internal Medicine

## 2014-10-02 ENCOUNTER — Other Ambulatory Visit: Payer: Self-pay | Admitting: Internal Medicine

## 2014-10-02 DIAGNOSIS — E032 Hypothyroidism due to medicaments and other exogenous substances: Secondary | ICD-10-CM

## 2014-10-02 DIAGNOSIS — C73 Malignant neoplasm of thyroid gland: Secondary | ICD-10-CM

## 2014-10-02 LAB — TSH+FREE T4
FREE T4: 1.59 ng/dL (ref 0.82–1.77)
TSH: 0.011 u[IU]/mL — AB (ref 0.450–4.500)

## 2014-10-02 LAB — THYROGLOBULIN ANTIBODY: Thyroglobulin Antibody: <1 IU/mL (ref 0.0–0.9)

## 2014-10-02 LAB — THYROGLOBULIN (TG-RIA): Thyroglobulin (TG-RIA): <2 ng/mL

## 2014-10-02 MED ORDER — SYNTHROID 125 MCG PO TABS: 125.0000 ug | ORAL_TABLET | Freq: Every day | ORAL | Status: DC

## 2014-11-26 ENCOUNTER — Ambulatory Visit (INDEPENDENT_AMBULATORY_CARE_PROVIDER_SITE_OTHER): Payer: BLUE CROSS/BLUE SHIELD | Admitting: Internal Medicine

## 2014-11-26 ENCOUNTER — Encounter: Payer: Self-pay | Admitting: Internal Medicine

## 2014-11-26 VITALS — BP 128/76 | HR 74 | Temp 98.3°F | Resp 12 | Wt 256.0 lb

## 2014-11-26 DIAGNOSIS — C73 Malignant neoplasm of thyroid gland: Secondary | ICD-10-CM

## 2014-11-26 DIAGNOSIS — E032 Hypothyroidism due to medicaments and other exogenous substances: Secondary | ICD-10-CM

## 2014-11-26 LAB — T4, FREE: FREE T4: 0.95 ng/dL (ref 0.60–1.60)

## 2014-11-26 LAB — TSH: TSH: 0.42 u[IU]/mL (ref 0.35–4.50)

## 2014-11-26 NOTE — Progress Notes (Signed)
Patient ID: Shelly Baldwin, female   DOB: 05-31-1966, 49 y.o.   MRN: [id number]   HPI  Shelly Baldwin is a 49 y.o.-year-old female, returning for f/u for thyroid cancer and postsurgical hypothyroidism. She moved here from [place] x 5 years. Last visit 1 year ago.  Reviewed hx: Pt. has been found to have a goiter with neck compression sxs, then had Bx >> inconclusive >> total thyroidectomy >> dx with thyroid cancer in 2004, had total thyroidectomy in 2005. She remembers having had RAI tx and a WBS. She had rTSH - stim WBS every year for 5 years >> no mets (up to 2010).  She has postsurgical hypothyroidism; is on Synthroid (brand) 175 >> 150 >> 125 mcg (changed 09/2014), taken: - fasting - with water - separated by >1h from b'fast  - no calcium, iron, PPIs, multivitamins   I reviewed pt's thyroid tests: Lab Results  Component Value Date   TSH 0.011* 09/26/2014   TSH 0.01* 03/22/2014   TSH 4.06 11/24/2013   TSH 0.21* 08/04/2012   TSH 0.34* 08/14/2011   FREET4 1.59 09/26/2014   FREET4 1.50 03/22/2014   FREET4 0.70 11/24/2013   FREET4 0.79 08/14/2011    Abs and Tg: Component     Latest Ref Rng 11/24/2013 03/22/2014 09/26/2014  Thyroglobulin     0.0 - 55.0 ng/mL <0.2    Thyroglobulin Ab     <40.0 U/mL 485.0 (H)    Antithyroglobulin Ab     0.0 - 0.9 IU/mL  <1.0   THYROGLOBULIN, SERUM     1.5 - 38.5 ng/mL  <0.1 (L)   THYROGLOBULIN (TG-RIA)        <2.0  Thyroglobulin Antibody     0.0 - 0.9 IU/mL   <1.0   08/14/2011: Thyroid Peroxidase Antibody <35.0 IU/mL  <10.0   Thyroglobulin Ab <40.0 U/mL  <20.0   Thyroglobulin 0.0 - 55.0 ng/mL  <0.2    From now on - need to send Tg + ATA to [facility].  Pt denies feeling nodules in neck, hoarseness, dysphagia/odynophagia, SOB with lying down.  Pt denies hypothyroid sxs: - cold intolerance - fatigue - constipation - dry skin - hair falling - depression - she only has + weight gain - started to work out every day at 5 am 5/7 days and in  pm 1/7  ROS: Constitutional: + weight gain, no fatigue, no subjective hyperthermia/hypothermia Eyes: no blurry vision, no xerophthalmia ENT: no sore throat, no nodules palpated in throat, no dysphagia/odynophagia, no hoarseness Cardiovascular: no CP/SOB/palpitations/leg swelling Respiratory: no cough/SOB Gastrointestinal: no N/V/D/C Musculoskeletal: no muscle/joint aches Skin: no rashes Neurological: no tremors/numbness/tingling/dizziness  I reviewed pt's medications, allergies, PMH, social hx, family hx, and changes were documented in the history of present illness. Otherwise, unchanged from my initial visit note:  Past Medical History  Diagnosis Date  . Cancer     undifferentiated thryoid   Past Surgical History  Procedure Laterality Date  . Thyroidectomy      2005  . G0p0     History   Social History  . Marital Status: Single    Spouse Name: N/A    Number of Children: 0  . Years of Education: 14   Occupational History  . technical support    Social History Main Topics  . Smoking status: Former Smoker    Quit date: 08/13/1986  . Smokeless tobacco: Never Used  . Alcohol Use: No  . Drug Use: No  . Sexual Activity: Not on file   Other  Topics Concern  . Not on file   Social History Narrative   HSG, [employer] in [place], matriculating [employer] (Sept '12). Single. Work - Loss adjuster, chartered at [employer]. \Do you exercise at least 3 times a week- twice a week with walking. Is there a smoke alarm in your house- yes. Are there guns and firearms in your household- no . Have you experienced physical abuse-no         Usual # of hours of sleep per night 6-7 hours per night   Number of people living at your residence- Pt lives alone   Current Outpatient Prescriptions on File Prior to Visit  Medication Sig Dispense Refill  . fluticasone (FLONASE) 50 MCG/ACT nasal spray Place 2 sprays into the nose daily as needed.    . loratadine (CLARITIN) 10 MG tablet Take 10 mg by  mouth daily as needed.    Marland Kitchen SYNTHROID 125 MCG tablet Take 1 tablet (125 mcg total) by mouth daily before breakfast. 60 tablet 1   No current facility-administered medications on file prior to visit.   No Known Allergies Family History  Problem Relation Age of Onset  . Hypertension Father   . Diabetes Father   . Hypothyroidism Father   . Cancer Father     prostate   PE: BP 128/76 mmHg  Pulse 74  Temp(Src) 98.3 F (36.8 C) (Oral)  Resp 12  Wt 256 lb (116.121 kg)  SpO2 97% Body mass index is 41.94 kg/(m^2). Wt Readings from Last 3 Encounters:  11/26/14 256 lb (116.121 kg)  12/13/13 245 lb (111.131 kg)  11/24/13 243 lb 8 oz (110.451 kg)   Constitutional: obese, in NAD Eyes: PERRLA, EOMI, no exophthalmos ENT: moist mucous membranes, no thyromegaly, no cervical lymphadenopathy, healed thyroid scar Cardiovascular: RRR, No MRG Respiratory: CTA B Gastrointestinal: abdomen soft, NT, ND, BS+ Musculoskeletal: no deformities, strength intact in all 4 Skin: moist, warm, no rashes Neurological: no tremor with outstretched hands, DTR normal in all 4  ASSESSMENT: 1. 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 Thyroid Cancer (stage 1 or 2) in remission  PLAN:  1. 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 She appears euthyroid. - We discussed about correct intake of levothyroxine, fasting, with water, separated by at least 30 minutes from breakfast, and separated by more than 4 hours from calcium, iron, multivitamins, acid reflux medications (PPIs). She is taking it correctly. - she has a h/o suppressed TSH levels >> we kept decreasing the LT4; we discussed that 10 years after her total thyroidectomy, with an undetectable Tg, and with repeated negative WBS's, we should aim for a normal TSH - She does not appear to have enlarged LNs or any palpable thyroid mass  - we'll check thyroid tests today: TSH,  free T4 - If these are abnormal, she will need to return in 6-8 weeks for repeat labs - If these are normal, I will see her back in 1 year  2. PTC - reviewed hx; discussed previous thyroid test results with pt - no signs and recurrence - will check Tg and ATA at next visit  Office Visit on 11/26/2014  Component Date Value Ref Range Status  . TSH 11/26/2014 0.42  0.35 - 4.50 uIU/mL Final  . Free T4 11/26/2014 0.95  0.60 - 1.60 ng/dL Final   Excellent TFTs >> continue 125 mcg LT4 daily

## 2014-11-26 NOTE — Patient Instructions (Signed)
Please stop at the lab.  Take the thyroid hormone every day, with water, >30 minutes before breakfast, separated by >4 hours from acid reflux medications, calcium, iron, multivitamins.  Please return in 1 year.

## 2014-11-28 ENCOUNTER — Telehealth: Payer: Self-pay | Admitting: Internal Medicine

## 2014-11-28 NOTE — Telephone Encounter (Signed)
Patient called stating she would like to know if Dr. Cruzita Lederer is lowering the dosage of her synthroid   Please advise    Thank you

## 2014-11-28 NOTE — Telephone Encounter (Signed)
Called pt and lvm advising her per Dr Arman Filter note. Advised pt to call with any questions or concerns.

## 2014-11-28 NOTE — Telephone Encounter (Signed)
Entered by Philemon Kingdom, MD at 11/26/2014 12:34 PM    Dear Ms Shelly Baldwin,  Excellent thyroid tests >> continue 125 mcg Synthroid daily. We will recheck them at next visit.  Sincerely,  Philemon Kingdom MD

## 2014-11-28 NOTE — Telephone Encounter (Signed)
Please read note below and advise.  

## 2015-02-06 ENCOUNTER — Other Ambulatory Visit: Payer: Self-pay | Admitting: *Deleted

## 2015-02-06 MED ORDER — SYNTHROID 125 MCG PO TABS: 125.0000 ug | ORAL_TABLET | Freq: Every day | ORAL | Status: DC

## 2015-03-18 ENCOUNTER — Other Ambulatory Visit: Payer: Self-pay

## 2015-03-18 DIAGNOSIS — Z1231 Encounter for screening mammogram for malignant neoplasm of breast: Secondary | ICD-10-CM

## 2015-03-19 ENCOUNTER — Ambulatory Visit
Admission: RE | Admit: 2015-03-19 | Discharge: 2015-03-19 | Disposition: A | Discharge status: Home / Self Care | Payer: BLUE CROSS/BLUE SHIELD | Source: Ambulatory Visit

## 2015-03-19 DIAGNOSIS — Z1231 Encounter for screening mammogram for malignant neoplasm of breast: Secondary | ICD-10-CM

## 2015-05-27 ENCOUNTER — Telehealth: Payer: Self-pay | Admitting: Internal Medicine

## 2015-05-27 MED ORDER — SYNTHROID 125 MCG PO TABS: 125.0000 ug | ORAL_TABLET | Freq: Every day | ORAL | Status: DC

## 2015-05-27 NOTE — Telephone Encounter (Signed)
Pt need refills for synthorid

## 2015-05-27 NOTE — Telephone Encounter (Signed)
Done

## 2015-06-27 ENCOUNTER — Other Ambulatory Visit: Payer: Self-pay | Admitting: *Deleted

## 2015-06-27 ENCOUNTER — Encounter: Payer: Self-pay | Admitting: Internal Medicine

## 2015-06-27 MED ORDER — SYNTHROID 125 MCG PO TABS: 125.0000 ug | ORAL_TABLET | Freq: Every day | ORAL | Status: DC

## 2015-08-13 ENCOUNTER — Encounter: Payer: Self-pay | Admitting: Internal Medicine

## 2015-08-13 ENCOUNTER — Other Ambulatory Visit: Payer: Self-pay | Admitting: *Deleted

## 2015-08-13 MED ORDER — SYNTHROID 125 MCG PO TABS: 125.0000 ug | ORAL_TABLET | Freq: Every day | ORAL | Status: DC

## 2015-08-28 ENCOUNTER — Encounter: Payer: Self-pay | Admitting: Internal Medicine

## 2015-08-29 ENCOUNTER — Other Ambulatory Visit: Payer: Self-pay | Admitting: *Deleted

## 2015-08-29 MED ORDER — SYNTHROID 125 MCG PO TABS: 125.0000 ug | ORAL_TABLET | Freq: Every day | ORAL | Status: DC

## 2015-08-29 NOTE — Telephone Encounter (Signed)
Pt stated she has not received her refill for her Synthroid. Resent.

## 2015-09-09 ENCOUNTER — Ambulatory Visit (INDEPENDENT_AMBULATORY_CARE_PROVIDER_SITE_OTHER): Payer: BLUE CROSS/BLUE SHIELD | Admitting: Family

## 2015-09-09 ENCOUNTER — Ambulatory Visit: Payer: BLUE CROSS/BLUE SHIELD

## 2015-09-09 ENCOUNTER — Encounter: Payer: Self-pay | Admitting: Family

## 2015-09-09 VITALS — BP 128/84 | HR 54 | Temp 98.2°F | Resp 18 | Ht 65.5 in | Wt 264.8 lb

## 2015-09-09 DIAGNOSIS — J069 Acute upper respiratory infection, unspecified: Secondary | ICD-10-CM

## 2015-09-09 MED ORDER — AZITHROMYCIN 250 MG PO TABS: ORAL_TABLET | ORAL | Status: DC

## 2015-09-09 NOTE — Patient Instructions (Signed)
Thank you for choosing [facility].  Summary/Instructions:  Please hold off on the antibiotic until at least tomorrow to see if you have any improvement. If not, then please start the antibiotic.   Your prescription(s) have been submitted to your pharmacy or been printed and provided for you. Please take as directed and contact our office if you believe you are having problem(s) with the medication(s) or have any questions.   If your symptoms worsen or fail to improve, please contact our office for further instruction, or in case of emergency go directly to the emergency room at the closest medical facility.   General Recommendations:    Please drink plenty of fluids.  Get plenty of rest   Sleep in humidified air  Use saline nasal sprays  Netti pot   OTC Medications:  Decongestants - helps relieve congestion   Flonase (generic fluticasone) or Nasacort (generic triamcinolone) - please make sure to use the "cross-over" technique at a 45 degree angle towards the opposite eye as opposed to straight up the nasal passageway.   If you have HIGH BLOOD PRESSURE - Coricidin HBP; AVOID any product that is -D as this contains pseudoephedrine which may increase your blood pressure.  Afrin (oxymetazoline) every 6-8 hours for up to 3 days.   Allergies - helps relieve runny nose, itchy eyes and sneezing   Claritin (generic loratidine), Allegra (fexofenidine), or Zyrtec (generic cyrterizine) for runny nose. These medications should not cause drowsiness.  Note - Benadryl (generic diphenhydramine) may be used however may cause drowsiness  Cough -   Delsym or Robitussin (generic dextromethorphan)  Expectorants - helps loosen mucus to ease removal   Mucinex (generic guaifenesin) as directed on the package.  Headaches / General Aches   Tylenol (generic acetaminophen) - DO NOT EXCEED 3 grams (3,000 mg) in a 24 hour time period  Advil/Motrin (generic ibuprofen)   Sore Throat  -   Salt water gargle   Chloraseptic (generic benzocaine) spray or lozenges / Sucrets (generic dyclonine)      Upper Respiratory Infection, Adult Most upper respiratory infections (URIs) are a viral infection of the air passages leading to the lungs. A URI affects the nose, throat, and upper air passages. The most common type of URI is nasopharyngitis and is typically referred to as "the common cold." URIs run their course and usually go away on their own. Most of the time, a URI does not require medical attention, but sometimes a bacterial infection in the upper airways can follow a viral infection. This is called a secondary infection. Sinus and middle ear infections are common types of secondary upper respiratory infections. Bacterial pneumonia can also complicate a URI. A URI can worsen asthma and chronic obstructive pulmonary disease (COPD). Sometimes, these complications can require emergency medical care and may be life threatening.  CAUSES Almost all URIs are caused by viruses. A virus is a type of germ and can spread from one person to another.  RISKS FACTORS You may be at risk for a URI if:  3. You smoke.  4. You have chronic heart or lung disease. 5. You have a weakened defense (immune) system.  46. You are very young or very old.  7. You have nasal allergies or asthma. 8. You work in crowded or poorly ventilated areas. 9. You work in health care facilities or schools. SIGNS AND SYMPTOMS  Symptoms typically develop 2-3 days after you come in contact with a cold virus. Most viral URIs last 7-10 days. However,  viral URIs from the influenza virus (flu virus) can last 14-18 days and are typically more severe. Symptoms may include:  2. Runny or stuffy (congested) nose.  3. Sneezing.  4. Cough.  5. Sore throat.  6. Headache.  7. Fatigue.  8. Fever.  9. Loss of appetite.  10. Pain in your forehead, behind your eyes, and over your cheekbones (sinus pain). 11. Muscle  aches.  DIAGNOSIS  Your health care provider may diagnose a URI by: 2. Physical exam. 3. Tests to check that your symptoms are not due to another condition such as: 1. Strep throat. 2. Sinusitis. 3. Pneumonia. 4. Asthma. TREATMENT  A URI goes away on its own with time. It cannot be cured with medicines, but medicines may be prescribed or recommended to relieve symptoms. Medicines may help: 3. Reduce your fever. 4. Reduce your cough. 5. Relieve nasal congestion. HOME CARE INSTRUCTIONS  3. Take medicines only as directed by your health care provider.  4. Gargle warm saltwater or take cough drops to comfort your throat as directed by your health care provider. 5. Use a warm mist humidifier or inhale steam from a shower to increase air moisture. This may make it easier to breathe. 6. Drink enough fluid to keep your urine clear or pale yellow.  7. Eat soups and other clear broths and maintain good nutrition.  8. Rest as needed.  9. Return to work when your temperature has returned to normal or as your health care provider advises. You may need to stay home longer to avoid infecting others. You can also use a face mask and careful hand washing to prevent spread of the virus. 10. Increase the usage of your inhaler if you have asthma.  11. Do not use any tobacco products, including cigarettes, chewing tobacco, or electronic cigarettes. If you need help quitting, ask your health care provider. PREVENTION  The best way to protect yourself from getting a cold is to practice good hygiene.  6. Avoid oral or hand contact with people with cold symptoms.  7. Wash your hands often if contact occurs.  There is no clear evidence that vitamin C, vitamin E, echinacea, or exercise reduces the chance of developing a cold. However, it is always recommended to get plenty of rest, exercise, and practice good nutrition.  SEEK MEDICAL CARE IF:   You are getting worse rather than better.   Your symptoms  are not controlled by medicine.   You have chills.  You have worsening shortness of breath.  You have brown or red mucus.  You have yellow or brown nasal discharge.  You have pain in your face, especially when you bend forward.  You have a fever.  You have swollen neck glands.  You have pain while swallowing.  You have white areas in the back of your throat. SEEK IMMEDIATE MEDICAL CARE IF:  2. You have severe or persistent: 1. Headache. 2. Ear pain. 3. Sinus pain. 4. Chest pain. 3. You have chronic lung disease and any of the following: 1. Wheezing. 2. Prolonged cough. 3. Coughing up blood. 4. A change in your usual mucus. 4. You have a stiff neck. 5. You have changes in your: 1. Vision. 2. Hearing. 3. Thinking. 4. Mood. MAKE SURE YOU:  3. Understand these instructions. 4. Will watch your condition. 5. Will get help right away if you are not doing well or get worse.   This information is not intended to replace advice given to you by your health  care provider. Make sure you discuss any questions you have with your health care provider.   Document Released: 04/28/2001 Document Revised: 03/19/2015 Document Reviewed: 02/07/2014 Elsevier Interactive Patient Education [employer].

## 2015-09-09 NOTE — Assessment & Plan Note (Signed)
Symptoms and exam consistent with upper respiratory infection which is questionably viral. Prescription provided to patient in printed form with instructions for watchful waiting. If symptoms worsen in next 2-3 days and start antibiotic. Continue over-the-counter medications as needed for symptom relief and supportive care. Follow-up if symptoms worsen or fail to improve.

## 2015-09-09 NOTE — Progress Notes (Signed)
Subjective:    Patient ID: Shelly Baldwin, female    DOB: [birth year], 49 y.o.   MRN: [id number]  Chief Complaint  Patient presents with  . Cough    x5 days cough and congestion    HPI:  Shelly Baldwin is a 49 y.o. female who  has a past medical history of Cancer ([place]). and presents today for an acute office visit.   This is a new problem. Associated symptoms of non-productive cough and congestion have been going on for approximately 5 days. Modifying factors include Robutussin which has helped a little with her symptoms. Course of the illness has stayed about the same with little improvement. Denies recent antibiotic use.   No Known Allergies   Current Outpatient Prescriptions on File Prior to Visit  Medication Sig Dispense Refill  . SYNTHROID 125 MCG tablet Take 1 tablet (125 mcg total) by mouth daily before breakfast. 60 tablet 1   No current facility-administered medications on file prior to visit.     Past Surgical History  Procedure Laterality Date  . Thyroidectomy      2005  . G0p0                                                                                Review of Systems  Constitutional: Negative for fever and chills.  HENT: Positive for congestion and sore throat. Negative for sinus pressure.   Respiratory: Positive for cough.   Gastrointestinal: Negative for nausea, vomiting and diarrhea.  Neurological: Negative for headaches.      Objective:    BP 128/84 mmHg  Pulse 54  Temp(Src) 98.2 F (36.8 C) (Oral)  Resp 18  Ht 5' 5.5" (1.664 m)  Wt 264 lb 12.8 oz (120.112 kg)  BMI 43.38 kg/m2  SpO2 98% Nursing note and vital signs reviewed.  Physical Exam  Constitutional: She is oriented to person, place, and time. She appears well-developed and well-nourished. No distress.  HENT:  Right Ear: Hearing, tympanic membrane, external ear and ear canal normal.  Left Ear: Hearing, tympanic membrane, external ear and ear canal normal.  Nose: Nose normal. Right  sinus exhibits no maxillary sinus tenderness and no frontal sinus tenderness. Left sinus exhibits no maxillary sinus tenderness and no frontal sinus tenderness.  Mouth/Throat: Uvula is midline, oropharynx is clear and moist and mucous membranes are normal.  Neck: Neck supple.  Cardiovascular: Normal rate, regular rhythm, normal heart sounds and intact distal pulses.   Pulmonary/Chest: Effort normal and breath sounds normal.  Lymphadenopathy:    She has no cervical adenopathy.  Neurological: She is alert and oriented to person, place, and time.  Skin: Skin is warm and dry.  Psychiatric: She has a normal mood and affect. Her behavior is normal. Judgment and thought content normal.       Assessment & Plan:   Problem List Items Addressed This Visit      Respiratory   URI, acute - Primary    Symptoms and exam consistent with upper respiratory infection which is questionably viral. Prescription provided to patient in printed form with instructions for watchful waiting. If symptoms worsen in next 2-3 days and start antibiotic. Continue over-the-counter medications as needed  for symptom relief and supportive care. Follow-up if symptoms worsen or fail to improve.      Relevant Medications   azithromycin (ZITHROMAX) 250 MG tablet

## 2015-09-09 NOTE — Progress Notes (Signed)
Pre visit review using our clinic review tool, if applicable. No additional management support is needed unless otherwise documented below in the visit note. 

## 2015-09-16 ENCOUNTER — Other Ambulatory Visit: Payer: Self-pay | Admitting: Family

## 2015-09-16 ENCOUNTER — Encounter: Payer: Self-pay | Admitting: Internal Medicine

## 2015-09-17 ENCOUNTER — Encounter: Payer: Self-pay | Admitting: Internal Medicine

## 2015-09-17 ENCOUNTER — Ambulatory Visit (INDEPENDENT_AMBULATORY_CARE_PROVIDER_SITE_OTHER): Payer: BLUE CROSS/BLUE SHIELD | Admitting: Internal Medicine

## 2015-09-17 VITALS — BP 134/88 | HR 53 | Temp 98.3°F | Resp 18 | Ht 65.5 in | Wt 265.0 lb

## 2015-09-17 DIAGNOSIS — J069 Acute upper respiratory infection, unspecified: Secondary | ICD-10-CM

## 2015-09-17 DIAGNOSIS — Z299 Encounter for prophylactic measures, unspecified: Secondary | ICD-10-CM

## 2015-09-17 DIAGNOSIS — I10 Essential (primary) hypertension: Secondary | ICD-10-CM | POA: Insufficient documentation

## 2015-09-17 DIAGNOSIS — R030 Elevated blood-pressure reading, without diagnosis of hypertension: Secondary | ICD-10-CM | POA: Diagnosis not present

## 2015-09-17 DIAGNOSIS — E669 Obesity, unspecified: Secondary | ICD-10-CM

## 2015-09-17 DIAGNOSIS — E032 Hypothyroidism due to medicaments and other exogenous substances: Secondary | ICD-10-CM

## 2015-09-17 NOTE — Assessment & Plan Note (Signed)
Following with endocrine Doing well on current dose of synthroid

## 2015-09-17 NOTE — Progress Notes (Signed)
Subjective:    Patient ID: Shelly Baldwin, female    DOB: 1966/07/21, 49 y.o.   MRN: [id number]  HPI She is here to establish with a new pcp. She saw Marya Amsler last week for congestion and cough.  She was prescribed a zpak for a sinus infection.  S he has been taking coricidan cold medication.  Last night she was clearing her throat a lot from postnasal drip.  She has a mild dry cough remaining, but it is getting better.   History of thyroid cancer:  She follows with endocrine.  Thyroid was removed in 2005. She takes her Synthroid daily as prescribed.  Obesity, family history of heart disease:  She has not exercised recently, because she has not been feeling well, but typically presents to 3-4 times a week. She knows she needs to work on weight loss. She is concerned about her family history of diabetes and heart disease. She was told that she was prediabetic. She could do better with her diet.  Medications and allergies reviewed with patient and updated if appropriate.  Patient Active Problem List   Diagnosis Date Noted  . Obesity, Class I, BMI 30.0-34.9 (see actual BMI) 12/15/2013  . URI, acute 05/02/2013  . Hypothyroidism, iatrogenic 08/04/2012  . Thyroid cancer ([place])   . Routine general medical examination at a health care facility     Past Medical History  Diagnosis Date  . Cancer (HCC)     undifferentiated thryoid  . Thyroid disease     Past Surgical History  Procedure Laterality Date  . Thyroidectomy      2005  . G0p0      Social History   Social History  . Marital Status: Single    Spouse Name: N/A  . Number of Children: 0  . Years of Education: 14   Occupational History  . technical support    Social History Main Topics  . Smoking status: Former Smoker    Quit date: 08/13/1986  . Smokeless tobacco: Never Used  . Alcohol Use: No  . Drug Use: No  . Sexual Activity: Not Asked   Other Topics Concern  . None   Social History Narrative   HSG, [employer] in [place], matriculating [employer] (Sept '12). Single. Work - Loss adjuster, chartered at [employer]. \Do you exercise at least 3 times a week- twice a week with walking. Is there a smoke alarm in your house- yes. Are there guns and firearms in your household- no . Have you experienced physical abuse-no         Usual # of hours of sleep per night 6-7 hours per night   Number of people living at your residence- Pt lives alone                      Review of Systems  Constitutional: Negative for fever and chills.  HENT: Positive for congestion, postnasal drip and sneezing. Negative for ear pain, sinus pressure and sore throat.   Respiratory: Positive for cough (dry). Negative for shortness of breath and wheezing.   Cardiovascular: Negative for chest pain, palpitations and leg swelling.  Gastrointestinal: Negative for nausea, abdominal pain, diarrhea, constipation and blood in stool.       No gerd  Genitourinary: Negative for dysuria and hematuria.  Musculoskeletal: Negative for myalgias, back pain and arthralgias.  Neurological: Negative for dizziness, light-headedness and headaches.  Psychiatric/Behavioral: Negative for dysphoric mood. The patient is not nervous/anxious.  Objective:   Filed Vitals:   09/17/15 1350  BP: 134/88  Pulse: 53  Temp: 98.3 F (36.8 C)  Resp: 18   Filed Weights   09/17/15 1350  Weight: 265 lb (120.203 kg)   Body mass index is 43.41 kg/(m^2).   Physical Exam  Constitutional: She appears well-developed and well-nourished. No distress.  HENT:  Head: Normocephalic and atraumatic.  Right Ear: External ear normal.  Left Ear: External ear normal.  Mouth/Throat: Oropharynx is clear and moist. No oropharyngeal exudate.  Eyes: Conjunctivae are normal.  Neck: Neck supple. No tracheal deviation present. No thyromegaly present.  No carotid bruit  Cardiovascular: Normal rate, regular rhythm and normal heart sounds.   No murmur heard. Pulmonary/Chest:  Effort normal and breath sounds normal. No respiratory distress. She has no wheezes.  Musculoskeletal: She exhibits no edema.  Lymphadenopathy:    She has no cervical adenopathy.        Assessment & Plan:   See Problem List.  Blood work ordered   Follow up annually   Referral ordered for GI - screening colonoscopy

## 2015-09-17 NOTE — Assessment & Plan Note (Signed)
Completed zpak Symptoms improved Still with mild dry cough, pnd, congestion Start anti-histamine or nasal spray Continue coricidan

## 2015-09-17 NOTE — Assessment & Plan Note (Signed)
Obesity - current BMI 43.41 Stressed regular exercise and most important decreased portions She understands the increased risk of diabetes and htn

## 2015-09-17 NOTE — Assessment & Plan Note (Signed)
BP borderline high here today and she has a family history Work on weight loss, exercise regularly, low sodium diet Start monitoring BP regularly Follow up at least once a year Discussed potential consequences of uncontrolled htn

## 2015-09-17 NOTE — Progress Notes (Signed)
Pre visit review using our clinic review tool, if applicable. No additional management support is needed unless otherwise documented below in the visit note. 

## 2015-09-17 NOTE — Patient Instructions (Addendum)
  We have reviewed your prior records including labs and tests today.  Test(s) ordered today. Your results will be released to [place] (or called to you) after review, usually within 72hours after test completion. If any changes need to be made, you will be notified at that same time.  All other Health Maintenance issues reviewed.   All recommended immunizations and age-appropriate screenings are up-to-date.  No immunizations administered today.   Medications reviewed and updated.  No changes recommended at this time.  Monitor your blood pressure - the goal is less than 140/90.  Work on weight loss.  Please followup annually for a physical exam.

## 2015-09-27 ENCOUNTER — Ambulatory Visit (INDEPENDENT_AMBULATORY_CARE_PROVIDER_SITE_OTHER): Payer: BLUE CROSS/BLUE SHIELD

## 2015-09-27 DIAGNOSIS — Z23 Encounter for immunization: Secondary | ICD-10-CM | POA: Diagnosis not present

## 2015-11-01 ENCOUNTER — Ambulatory Visit: Payer: BLUE CROSS/BLUE SHIELD | Admitting: Internal Medicine

## 2015-11-13 ENCOUNTER — Encounter: Payer: Self-pay | Admitting: Internal Medicine

## 2015-11-19 ENCOUNTER — Encounter: Payer: Self-pay | Admitting: Internal Medicine

## 2015-11-27 ENCOUNTER — Ambulatory Visit: Payer: BC Managed Care – PPO | Admitting: Internal Medicine

## 2015-11-29 ENCOUNTER — Encounter: Payer: Self-pay | Admitting: Internal Medicine

## 2015-11-29 ENCOUNTER — Ambulatory Visit (INDEPENDENT_AMBULATORY_CARE_PROVIDER_SITE_OTHER): Payer: BLUE CROSS/BLUE SHIELD | Admitting: Internal Medicine

## 2015-11-29 VITALS — BP 118/64 | HR 67 | Temp 98.4°F | Resp 12 | Wt 261.8 lb

## 2015-11-29 DIAGNOSIS — E032 Hypothyroidism due to medicaments and other exogenous substances: Secondary | ICD-10-CM

## 2015-11-29 DIAGNOSIS — C73 Malignant neoplasm of thyroid gland: Secondary | ICD-10-CM | POA: Diagnosis not present

## 2015-11-29 LAB — TSH: TSH: 2.26 u[IU]/mL (ref 0.35–4.50)

## 2015-11-29 LAB — T4, FREE: Free T4: 0.86 ng/dL (ref 0.60–1.60)

## 2015-11-29 NOTE — Progress Notes (Signed)
Patient ID: Shelly Baldwin, female   DOB: 10/31/1966, 50 y.o.   MRN: [id number]   HPI  Shelly Baldwin is a 50 y.o.-year-old female, returning for f/u for thyroid cancer and postsurgical hypothyroidism. Last visit 1 year ago.  Reviewed hx: Pt. has been found to have a goiter with neck compression sxs, then had Bx >> inconclusive >> total thyroidectomy >> dx with thyroid cancer in 2004, had total thyroidectomy in 2005. She remembers having had RAI tx and a WBS. She had rTSH - stim WBS every year for 5 years >> no mets (up to 2010).  She has postsurgical hypothyroidism; is on Synthroid (brand) 175 >> 150 >> 125 mcg (changed 09/2014), taken: - fasting - with water - separated by >1h from b'fast  - no calcium, iron, PPIs, multivitamins  - may forget doses on Saturday!  She was on Biotin but stopped this summer.  I reviewed pt's thyroid tests: Lab Results  Component Value Date   TSH 0.42 11/26/2014   TSH 0.011* 09/26/2014   TSH 0.01* 03/22/2014   TSH 4.06 11/24/2013   TSH 0.21* 08/04/2012   TSH 0.34* 08/14/2011   FREET4 0.95 11/26/2014   FREET4 1.59 09/26/2014   FREET4 1.50 03/22/2014   FREET4 0.70 11/24/2013   FREET4 0.79 08/14/2011    Abs and Tg: Component     Latest Ref Rng 11/24/2013 03/22/2014 09/26/2014  Thyroglobulin     0.0 - 55.0 ng/mL <0.2    Thyroglobulin Ab     <40.0 U/mL 485.0 (H)    Antithyroglobulin Ab     0.0 - 0.9 IU/mL  <1.0   THYROGLOBULIN, SERUM     1.5 - 38.5 ng/mL  <0.1 (L)   THYROGLOBULIN (TG-RIA)        <2.0  Thyroglobulin Antibody     0.0 - 0.9 IU/mL   <1.0   08/14/2011: Thyroid Peroxidase Antibody <35.0 IU/mL  <10.0   Thyroglobulin Ab <40.0 U/mL  <20.0   Thyroglobulin 0.0 - 55.0 ng/mL  <0.2    From now on - need to send Tg + ATA to [facility].  Pt denies feeling nodules in neck, hoarseness, dysphagia/odynophagia, SOB with lying down.  Pt denies hypothyroid sxs: - weight gain - cold intolerance - fatigue - constipation - dry skin - hair  falling - depression  ROS: Constitutional: + weight loss, no fatigue, no subjective hyperthermia/hypothermia Eyes: no blurry vision, no xerophthalmia ENT: no sore throat, no nodules palpated in throat, no dysphagia/odynophagia, no hoarseness Cardiovascular: no CP/SOB/palpitations/leg swelling Respiratory: no cough/SOB Gastrointestinal: no N/V/D/C Musculoskeletal: no muscle/joint aches Skin: no rashes Neurological: no tremors/numbness/tingling/dizziness  I reviewed pt's medications, allergies, PMH, social hx, family hx, and changes were documented in the history of present illness. Otherwise, unchanged from my initial visit note:  Past Medical History  Diagnosis Date  . Cancer (HCC)     undifferentiated thryoid  . Thyroid disease    Past Surgical History  Procedure Laterality Date  . Thyroidectomy      2005  . G0p0     History   Social History  . Marital Status: Single    Spouse Name: N/A    Number of Children: 0  . Years of Education: 14   Occupational History  . technical support    Social History Main Topics  . Smoking status: Former Smoker    Quit date: 08/13/1986  . Smokeless tobacco: Never Used  . Alcohol Use: No  . 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 Single. Work - Loss adjuster, chartered at [employer]. \Do you exercise at least 3 times a week- twice a week with walking. Is there a smoke alarm in your house- yes. Are there guns and firearms in your household- no . Have you experienced physical abuse-no         Usual # of hours of sleep per night 6-7 hours per night   Number of people living at your residence- Pt lives alone   Current Outpatient Prescriptions on File Prior to Visit  Medication Sig Dispense Refill  . Chlorpheniramine-DM (CORICIDIN HBP COUGH/COLD PO) Take by mouth.    . SYNTHROID 125 MCG tablet Take 1 tablet (125 mcg total) by mouth daily before breakfast. 60 tablet 1   No  current facility-administered medications on file prior to visit.   No Known Allergies Family History  Problem Relation Age of Onset  . Hypertension Father   . Diabetes Father   . Hypothyroidism Father   . Cancer Father     prostate   PE: BP 118/64 mmHg  Pulse 67  Temp(Src) 98.4 F (36.9 C) (Oral)  Resp 12  Wt 261 lb 12.8 oz (118.752 kg)  SpO2 98% Body mass index is 42.89 kg/(m^2). Wt Readings from Last 3 Encounters:  11/29/15 261 lb 12.8 oz (118.752 kg)  09/17/15 265 lb (120.203 kg)  09/09/15 264 lb 12.8 oz (120.112 kg)   Constitutional: obese, in NAD Eyes: PERRLA, EOMI, no exophthalmos ENT: moist mucous membranes, no thyromegaly, no cervical lymphadenopathy, healed thyroid scar with a little cheloid Cardiovascular: RRR, No MRG Respiratory: CTA B Gastrointestinal: abdomen soft, NT, ND, BS+ Musculoskeletal: no deformities, strength intact in all 4 Skin: moist, warm, no rashes Neurological: no tremor with outstretched hands, DTR normal in all 4  ASSESSMENT: 1. Postsurgical Hypothyroidism - on Brand name Synthroid  2. Thyroid Cancer (stage 1 or 2) in remission  PLAN:  1. Patient with long-standing iatrogenic hypothyroidism after surgery for thyroid cancer -  on levothyroxine therapy (Synthroid 125 mcg daily). She appears euthyroid. - We discussed about correct intake of levothyroxine, fasting, with water, separated by at least 30 minutes from breakfast, and separated by more than 4 hours from calcium, iron, multivitamins, acid reflux medications (PPIs). She is taking it correctly, but may forget to take it on Sat. Advised her to place the LT4 bottle on her nightstand, near her phone. - she has a h/o suppressed TSH levels >> we kept decreasing the LT4; we discussed that 10 years after her total thyroidectomy, with an undetectable Tg, and with repeated negative WBS's, we should aim for a normal TSH - She does not appear to have enlarged LNs or any palpable thyroid mass  -  we'll check thyroid tests today: TSH, free T4 - If these are abnormal, she will need to return in 5-6 weeks for repeat labs - If these are normal, I will see her back in 1 year  2. PTC - reviewed hx; discussed previous thyroid test results with pt - no signs and recurrence - will check Tg and ATA ([facility]) today  Needs refills - [facility] - 3 months.  Component     Latest Ref Rng 11/29/2015  TSH     0.35 - 4.50 uIU/mL 2.26  Free T4     0.60 - 1.60 ng/dL 0.86  THYROGLOBULIN (TG-RIA)      <2.0  Thyroglobulin Antibody     0.0 - 0.9 IU/mL <1.0  Excellent  results. Will refill her Synthroid.

## 2015-11-29 NOTE — Patient Instructions (Signed)
Please stop at the lab.  Continue Synthroid 125 mcg daily.  Take the thyroid hormone every day, with water, at least 30 minutes before breakfast, separated by at least 4 hours from: - acid reflux medications - calcium - iron - multivitamins  Please return in 1 year.  

## 2015-12-04 LAB — THYROGLOBULIN LEVEL: Thyroglobulin (TG-RIA): <2 ng/mL

## 2015-12-04 LAB — THYROGLOBULIN ANTIBODY: Thyroglobulin Antibody: <1 IU/mL (ref 0.0–0.9)

## 2015-12-04 MED ORDER — SYNTHROID 125 MCG PO TABS: 125.0000 ug | ORAL_TABLET | Freq: Every day | ORAL | Status: DC

## 2015-12-16 ENCOUNTER — Ambulatory Visit (INDEPENDENT_AMBULATORY_CARE_PROVIDER_SITE_OTHER): Payer: BLUE CROSS/BLUE SHIELD | Admitting: Internal Medicine

## 2015-12-16 ENCOUNTER — Other Ambulatory Visit (INDEPENDENT_AMBULATORY_CARE_PROVIDER_SITE_OTHER): Payer: BLUE CROSS/BLUE SHIELD

## 2015-12-16 ENCOUNTER — Encounter: Payer: Self-pay | Admitting: Internal Medicine

## 2015-12-16 VITALS — BP 134/88 | HR 71 | Temp 98.3°F | Resp 16 | Wt 255.0 lb

## 2015-12-16 DIAGNOSIS — R197 Diarrhea, unspecified: Secondary | ICD-10-CM

## 2015-12-16 DIAGNOSIS — A09 Infectious gastroenteritis and colitis, unspecified: Secondary | ICD-10-CM

## 2015-12-16 DIAGNOSIS — R1030 Lower abdominal pain, unspecified: Secondary | ICD-10-CM | POA: Diagnosis not present

## 2015-12-16 LAB — COMPREHENSIVE METABOLIC PANEL
ALT: 13 U/L (ref 0–35)
AST: 18 U/L (ref 0–37)
Albumin: 4.3 g/dL (ref 3.5–5.2)
Alkaline Phosphatase: 64 U/L (ref 39–117)
BILIRUBIN TOTAL: 0.4 mg/dL (ref 0.2–1.2)
BUN: 11 mg/dL (ref 6–23)
CALCIUM: 9.5 mg/dL (ref 8.4–10.5)
CO2: 27 meq/L (ref 19–32)
Chloride: 101 mEq/L (ref 96–112)
Creatinine, Ser: 1.15 mg/dL (ref 0.40–1.20)
GFR: 53.02 mL/min — AB (ref >60.00)
Glucose, Bld: 94 mg/dL (ref 70–99)
POTASSIUM: 3.4 meq/L — AB (ref 3.5–5.1)
Sodium: 137 mEq/L (ref 135–145)
Total Protein: 7.5 g/dL (ref 6.0–8.3)

## 2015-12-16 LAB — CBC WITH DIFFERENTIAL/PLATELET
BASOS ABS: 0 10*3/uL (ref 0.0–0.1)
Basophils Relative: 0.4 % (ref 0.0–3.0)
EOS PCT: 1.7 % (ref 0.0–5.0)
Eosinophils Absolute: 0.1 10*3/uL (ref 0.0–0.7)
HCT: 37.3 % (ref 36.0–46.0)
HEMOGLOBIN: 12.4 g/dL (ref 12.0–15.0)
LYMPHS ABS: 0.5 10*3/uL — AB (ref 0.7–4.0)
LYMPHS PCT: 13.4 % (ref 12.0–46.0)
MCHC: 33.2 g/dL (ref 30.0–36.0)
MCV: 91 fl (ref 78.0–100.0)
MONOS PCT: 12.6 % — AB (ref 3.0–12.0)
Monocytes Absolute: 0.5 10*3/uL (ref 0.1–1.0)
NEUTROS PCT: 71.9 % (ref 43.0–77.0)
Neutro Abs: 2.7 10*3/uL (ref 1.4–7.7)
Platelets: 250 10*3/uL (ref 150.0–400.0)
RBC: 4.1 Mil/uL (ref 3.87–5.11)
RDW: 15.2 % (ref 11.5–15.5)
WBC: 3.7 10*3/uL — ABNORMAL LOW (ref 4.0–10.5)

## 2015-12-16 NOTE — Progress Notes (Signed)
Subjective:    Patient ID: Shelly Baldwin, female    DOB: 09-26-1966, 50 y.o.   MRN: [id number]  HPI She is here for an acute visit for stomach pain and diarrhea.   Her symptoms started three days ago in the afternoon.  She initally felt a stomach ache.  She thought it was just related to something she ate. She sipped on gingerale.  It got worse and then better. The next day, 2 days ago, it was worse again. She took Pepto-Bismol and it got a little better.  2 days ago in the morning was her last normal bowel movement.  She has been experiencing lower abdominal pain and she would go to the bathroom and it was diarrhea.  The diarrhea is nonbloody. The pain would go away after having a bowel movement and then come back.  Yesterday she took ducolax to try ot clean herself out.  She has been eating a bland diet and has not been eating much.  She has been drinking poweraide.  She has symtpoms every hour typically.  Last night and the night before she has had pain in her abdomen causing difficutly sleeping.    She has had some coworkers with URIs.  She did eat some shrimp the day before the symptoms started, but denies any unusual foods. She denies travel.    Medications and allergies reviewed with patient and updated if appropriate.  Patient Active Problem List   Diagnosis Date Noted  . Borderline hypertension 09/17/2015  . Obesity, Class I, BMI 30.0-34.9 (see actual BMI) 12/15/2013  . URI, acute 05/02/2013  . Hypothyroidism, iatrogenic 08/04/2012  . Thyroid cancer ([place])   . Routine general medical examination at a health care facility     Current Outpatient Prescriptions on File Prior to Visit  Medication Sig Dispense Refill  . SYNTHROID 125 MCG tablet Take 1 tablet (125 mcg total) by mouth daily before breakfast. 90 tablet 3   No current facility-administered medications on file prior to visit.    Past Medical History  Diagnosis Date  . Cancer (HCC)     undifferentiated thryoid  .  Thyroid disease     Past Surgical History  Procedure Laterality Date  . Thyroidectomy      2005  . G0p0      Social History   Social History  . Marital Status: Single    Spouse Name: N/A  . Number of Children: 0  . Years of Education: 14   Occupational History  . technical support    Social History Main Topics  . Smoking status: Former Smoker    Quit date: 08/13/1986  . Smokeless tobacco: Never Used  . Alcohol Use: No  . Drug Use: No  . Sexual Activity: Not Asked   Other Topics Concern  . None   Social History Narrative   HSG, [employer] in [place], matriculating [employer] (Sept '12). Single. Work - Loss adjuster, chartered at [employer]. \Do you exercise at least 3 times a week- twice a week with walking. Is there a smoke alarm in your house- yes. Are there guns and firearms in your household- no . Have you experienced physical abuse-no         Usual # of hours of sleep per night 6-7 hours per night   Number of people living at your residence- Pt lives alone                      Family History  Problem Relation Age of Onset  . Hypertension Father   . Diabetes Father   . Hypothyroidism Father   . Cancer Father     prostate    Review of Systems  Constitutional: Positive for appetite change (decreased). Negative for fever and chills.  HENT: Negative for congestion and sore throat.   Respiratory: Negative for cough, shortness of breath and wheezing.   Cardiovascular: Negative for chest pain.  Gastrointestinal: Positive for abdominal pain and diarrhea. Negative for vomiting and blood in stool.  Neurological: Negative for light-headedness and headaches.       Objective:   Filed Vitals:   12/16/15 1453  BP: 134/88  Pulse: 71  Temp: 98.3 F (36.8 C)  Resp: 16   Filed Weights   12/16/15 1453  Weight: 255 lb (115.667 kg)   Body mass index is 41.77 kg/(m^2).   Physical Exam GENERAL APPEARANCE: Appears stated age, well appearing, NAD EYES: conjunctiva  clear, no icterus HEENT: bilateral tympanic membranes and ear canals normal, oropharynx with no erythema, no thyromegaly, trachea midline, no cervical or supraclavicular lymphadenopathy LUNGS: Clear to auscultation without wheeze or crackles, unlabored breathing, good air entry bilaterally HEART: Normal S1,S2 without murmurs EXTREMITIES: Without clubbing, cyanosis, or edema ABDOMEN: Obese, soft, nontender, nondistended      Assessment & Plan:   Diarrhea and lower abdominal pain Likely viral gastroenteritis, less likely bacterial gastroenteritis or colitis She has never had a colonoscopy-has been referred for screening colonoscopy. Given that she is nontender on exam I doubt that she has colitis We'll check routine blood work-CBC, CMP and stool cultures Discussed that I believe this is likely viral and should improve over the next few days Advised her to avoid any stool softeners or Imodium/Pepto-Bismol Continue increased fluids Bland diet/ BRAT diet  She will call her symptoms worsen or do not improve and we will call her with the results of her tests Note given for work

## 2015-12-16 NOTE — Progress Notes (Signed)
Pre visit review using our clinic review tool, if applicable. No additional management support is needed unless otherwise documented below in the visit note. 

## 2015-12-16 NOTE — Patient Instructions (Addendum)
We have reviewed your prior records including labs and tests today.  Test(s) ordered today. Your results will be released to [place] (or called to you) after review, usually within 72hours after test completion. If any changes need to be made, you will be notified at that same time.  Call if your symptoms do not improve or worsen.    Viral Gastroenteritis Viral gastroenteritis is also known as stomach flu. This condition affects the stomach and intestinal tract. It can cause sudden diarrhea and vomiting. The illness typically lasts 3 to 8 days. Most people develop an immune response that eventually gets rid of the virus. While this natural response develops, the virus can make you quite ill. CAUSES  Many different viruses can cause gastroenteritis, such as rotavirus or noroviruses. You can catch one of these viruses by consuming contaminated food or water. You may also catch a virus by sharing utensils or other personal items with an infected person or by touching a contaminated surface. SYMPTOMS  The most common symptoms are diarrhea and vomiting. These problems can cause a severe loss of body fluids (dehydration) and a body salt (electrolyte) imbalance. Other symptoms may include:  Fever.  Headache.  Fatigue.  Abdominal pain. DIAGNOSIS  Your caregiver can usually diagnose viral gastroenteritis based on your symptoms and a physical exam. A stool sample may also be taken to test for the presence of viruses or other infections. TREATMENT  This illness typically goes away on its own. Treatments are aimed at rehydration. The most serious cases of viral gastroenteritis involve vomiting so severely that you are not able to keep fluids down. In these cases, fluids must be given through an intravenous line (IV). HOME CARE INSTRUCTIONS   Drink enough fluids to keep your urine clear or pale yellow. Drink small amounts of fluids frequently and increase the amounts as tolerated.  Ask your  caregiver for specific rehydration instructions.  Avoid:  Foods high in sugar.  Alcohol.  Carbonated drinks.  Tobacco.  Juice.  Caffeine drinks.  Extremely hot or cold fluids.  Fatty, greasy foods.  Too much intake of anything at one time.  Dairy products until 24 to 48 hours after diarrhea stops.  You may consume probiotics. Probiotics are active cultures of beneficial bacteria. They may lessen the amount and number of diarrheal stools in adults. Probiotics can be found in yogurt with active cultures and in supplements.  Wash your hands well to avoid spreading the virus.  Only take over-the-counter or prescription medicines for pain, discomfort, or fever as directed by your caregiver. Do not give aspirin to children. Antidiarrheal medicines are not recommended.  Ask your caregiver if you should continue to take your regular prescribed and over-the-counter medicines.  Keep all follow-up appointments as directed by your caregiver. SEEK IMMEDIATE MEDICAL CARE IF:   You are unable to keep fluids down.  You do not urinate at least once every 6 to 8 hours.  You develop shortness of breath.  You notice blood in your stool or vomit. This may look like coffee grounds.  You have abdominal pain that increases or is concentrated in one small area (localized).  You have persistent vomiting or diarrhea.  You have a fever.  The patient is a child younger than 3 months, and he or she has a fever.  The patient is a child older than 3 months, and he or she has a fever and persistent symptoms.  The patient is a child older than 3 months, and he  or she has a fever and symptoms suddenly get worse.  The patient is a baby, and he or she has no tears when crying. MAKE SURE YOU:   Understand these instructions.  Will watch your condition.  Will get help right away if you are not doing well or get worse.   This information is not intended to replace advice given to you by your  health care provider. Make sure you discuss any questions you have with your health care provider.   Document Released: 11/02/2005 Document Revised: 01/25/2012 Document Reviewed: 08/19/2011 Elsevier Interactive Patient Education [employer].

## 2015-12-17 ENCOUNTER — Encounter: Payer: Self-pay | Admitting: Internal Medicine

## 2015-12-17 ENCOUNTER — Other Ambulatory Visit: Payer: BLUE CROSS/BLUE SHIELD

## 2015-12-17 DIAGNOSIS — R1030 Lower abdominal pain, unspecified: Secondary | ICD-10-CM

## 2015-12-17 DIAGNOSIS — R197 Diarrhea, unspecified: Secondary | ICD-10-CM

## 2015-12-18 LAB — C. DIFFICILE GDH AND TOXIN A/B
C. difficile GDH: NOT DETECTED
C. difficile Toxin A/B: NOT DETECTED

## 2015-12-23 ENCOUNTER — Telehealth: Payer: Self-pay | Admitting: Emergency Medicine

## 2015-12-23 LAB — GIARDIA/CRYPTOSPORIDIUM (EIA)

## 2015-12-23 NOTE — Telephone Encounter (Signed)
[facility] called stating that pt had salmonella show in her stool specimen that was sent last week. They are now sending [facility].

## 2015-12-23 NOTE — Telephone Encounter (Signed)
Call her and see how she is feeling.  Let her know there was some salmonella in her stool.  This typically does not need to be treated - it is self limiting.

## 2015-12-24 ENCOUNTER — Telehealth: Payer: Self-pay | Admitting: Emergency Medicine

## 2015-12-24 NOTE — Telephone Encounter (Signed)
LVM for pt to call back.

## 2015-12-24 NOTE — Telephone Encounter (Signed)
Spoke with pt to inform about result from [facility].

## 2016-01-09 ENCOUNTER — Telehealth: Payer: Self-pay | Admitting: Emergency Medicine

## 2016-01-09 NOTE — Telephone Encounter (Signed)
[facility] requested [facility]ts demographic and c/c due to pt being dxes with salmonella. Faxed request over.

## 2016-01-17 LAB — STOOL CULTURE

## 2016-02-04 ENCOUNTER — Other Ambulatory Visit: Payer: Self-pay | Admitting: *Deleted

## 2016-02-04 MED ORDER — SYNTHROID 125 MCG PO TABS: 125.0000 ug | ORAL_TABLET | Freq: Every day | ORAL | Status: DC

## 2016-02-12 ENCOUNTER — Other Ambulatory Visit: Payer: Self-pay

## 2016-02-12 DIAGNOSIS — Z1231 Encounter for screening mammogram for malignant neoplasm of breast: Secondary | ICD-10-CM

## 2016-03-20 ENCOUNTER — Ambulatory Visit: Payer: BLUE CROSS/BLUE SHIELD

## 2016-03-30 ENCOUNTER — Ambulatory Visit
Admission: RE | Admit: 2016-03-30 | Discharge: 2016-03-30 | Disposition: A | Discharge status: Home / Self Care | Payer: BLUE CROSS/BLUE SHIELD | Source: Ambulatory Visit

## 2016-03-30 DIAGNOSIS — Z1231 Encounter for screening mammogram for malignant neoplasm of breast: Secondary | ICD-10-CM

## 2016-05-17 ENCOUNTER — Emergency Department (HOSPITAL_COMMUNITY): Payer: BLUE CROSS/BLUE SHIELD

## 2016-05-17 ENCOUNTER — Encounter (HOSPITAL_COMMUNITY): Payer: Self-pay | Admitting: Emergency Medicine

## 2016-05-17 ENCOUNTER — Emergency Department (HOSPITAL_COMMUNITY)
Admission: EM | Admit: 2016-05-17 | Discharge: 2016-05-17 | Disposition: A | Discharge status: Home / Self Care | Payer: BLUE CROSS/BLUE SHIELD | Attending: Emergency Medicine | Admitting: Emergency Medicine

## 2016-05-17 DIAGNOSIS — Z79899 Other long term (current) drug therapy: Secondary | ICD-10-CM | POA: Diagnosis not present

## 2016-05-17 DIAGNOSIS — R079 Chest pain, unspecified: Secondary | ICD-10-CM | POA: Insufficient documentation

## 2016-05-17 DIAGNOSIS — R142 Eructation: Secondary | ICD-10-CM | POA: Diagnosis not present

## 2016-05-17 DIAGNOSIS — Z85850 Personal history of malignant neoplasm of thyroid: Secondary | ICD-10-CM | POA: Insufficient documentation

## 2016-05-17 DIAGNOSIS — Z87891 Personal history of nicotine dependence: Secondary | ICD-10-CM | POA: Insufficient documentation

## 2016-05-17 LAB — BASIC METABOLIC PANEL
Anion gap: 9 (ref 5–15)
BUN: 15 mg/dL (ref 6–20)
CHLORIDE: 107 mmol/L (ref 101–111)
CO2: 19 mmol/L — AB (ref 22–32)
CREATININE: 1.03 mg/dL — AB (ref 0.44–1.00)
Calcium: 9.8 mg/dL (ref 8.9–10.3)
GFR calc Af Amer: >60 mL/min (ref >60)
GFR calc non Af Amer: >60 mL/min (ref >60)
Glucose, Bld: 108 mg/dL — ABNORMAL HIGH (ref 65–99)
Potassium: 4 mmol/L (ref 3.5–5.1)
Sodium: 135 mmol/L (ref 135–145)

## 2016-05-17 LAB — CBC
HCT: 34.4 % — ABNORMAL LOW (ref 36.0–46.0)
Hemoglobin: 11 g/dL — ABNORMAL LOW (ref 12.0–15.0)
MCH: 28 pg (ref 26.0–34.0)
MCHC: 32 g/dL (ref 30.0–36.0)
MCV: 87.5 fL (ref 78.0–100.0)
PLATELETS: 294 10*3/uL (ref 150–400)
RBC: 3.93 MIL/uL (ref 3.87–5.11)
RDW: 15.9 % — AB (ref 11.5–15.5)
WBC: 4.8 10*3/uL (ref 4.0–10.5)

## 2016-05-17 LAB — I-STAT TROPONIN, ED: Troponin i, poc: 0.01 ng/mL (ref 0.00–0.08)

## 2016-05-17 MED ORDER — PANTOPRAZOLE SODIUM 20 MG PO TBEC: 20.0000 mg | DELAYED_RELEASE_TABLET | Freq: Every day | ORAL | Status: DC

## 2016-05-17 MED ORDER — PANTOPRAZOLE SODIUM 40 MG PO TBEC
40.0000 mg | DELAYED_RELEASE_TABLET | Freq: Once | ORAL | Status: AC
  Administered 2016-05-17: 40 mg via ORAL
  Filled 2016-05-17: qty 1

## 2016-05-17 MED ORDER — GI COCKTAIL ~~LOC~~
30.0000 mL | Freq: Once | ORAL | Status: AC
  Administered 2016-05-17: 30 mL via ORAL
  Filled 2016-05-17: qty 30

## 2016-05-17 NOTE — ED Provider Notes (Signed)
CSN: [id number]     Arrival date & time 05/17/16  1503 History   First MD Initiated Contact with Patient 05/17/16 1538     Chief Complaint  Patient presents with  . Chest Pain     (Consider location/radiation/quality/duration/timing/severity/associated sxs/prior Treatment) HPI She reports that she took her Synthroid pill this morning. She reports that as she was swallowing it she got a sudden sharp pain on the right upper central chest. It radiated slightly towards the right. She reports that pain started to ease off and then she began having frequent belching. Belching has continued through the remainder of the day. She reports she still has a slight soreness to her right upper chest. She did not have any associated nausea diaphoresis or shortness of breath. No Radiation of pain. Patient reports only similar happened a few months ago when she ate a certain kind of DIP. She reports as soon as she swallowed it she got a pain in her upper chest followed by belching. At that time she did try an antacid medication and got relief. She does not take any antacid medications regularly. She has not tried any antacid medicines today. Past Medical History  Diagnosis Date  . Cancer (HCC)     undifferentiated thryoid  . Thyroid disease    Past Surgical History  Procedure Laterality Date  . Thyroidectomy      2005  . G0p0     Family History  Problem Relation Age of Onset  . Hypertension Father   . Diabetes Father   . Hypothyroidism Father   . Cancer Father     prostate   Social History  Substance Use Topics  . Smoking status: Former Smoker    Quit date: 08/13/1986  . Smokeless tobacco: Never Used  . Alcohol Use: No   OB History    No data available     Review of Systems 10 Systems reviewed and are negative for acute change except as noted in the HPI.    Allergies  Review of patient's allergies indicates no known allergies.  Home Medications   Prior to Admission medications    Medication Sig Start Date End Date Taking? Authorizing Provider  SYNTHROID 125 MCG tablet Take 1 tablet (125 mcg total) by mouth daily before breakfast. 02/04/16   Philemon Kingdom, MD   BP 162/88 mmHg  Pulse 88  Temp(Src) 98.3 F (36.8 C) (Oral)  Resp 14  SpO2 100%  LMP 05/03/2016 Physical Exam  Constitutional: She is oriented to person, place, and time. She appears well-developed and well-nourished. No distress.  HENT:  Head: Normocephalic and atraumatic.  Eyes: EOM are normal. Pupils are equal, round, and reactive to light.  Neck: Neck supple.  Cardiovascular: Normal rate, regular rhythm, normal heart sounds and intact distal pulses.   Pulmonary/Chest: Effort normal and breath sounds normal. She exhibits no tenderness.  Abdominal: Soft. Bowel sounds are normal. She exhibits no distension. There is no tenderness.  Musculoskeletal: Normal range of motion. She exhibits no edema or tenderness.  Neurological: She is alert and oriented to person, place, and time. She has normal strength. Coordination normal. GCS eye subscore is 4. GCS verbal subscore is 5. GCS motor subscore is 6.  Skin: Skin is warm, dry and intact.  Psychiatric: She has a normal mood and affect.    ED Course  Procedures (including critical care time) Labs Review Labs Reviewed  BASIC METABOLIC PANEL  CBC  I-STAT [place], ED    Imaging Review Dg Chest 2  View  05/17/2016  CLINICAL DATA:  Hervey Ard RIGHT chest pain, indigestion after taking Synthroid today. History of thyroid cancer. EXAM: CHEST  2 VIEW COMPARISON:  None. FINDINGS: Low inspiratory examination. Cardiomediastinal silhouette is normal. No pleural effusion or focal consolidation. No pneumothorax. Soft tissue planes and included osseous structure nonsuspicious. Mild broad lower thoracic dextroscoliosis. IMPRESSION: No acute cardiopulmonary process for this low inspiratory examination. Electronically Signed   By: Elon Alas M.D.   On: 05/17/2016 16:09    I have personally reviewed and evaluated these images and lab results as part of my medical decision-making.   EKG Interpretation   Date/Time:  Sunday May 17 2016 15:07:43 EDT Ventricular Rate:  84 PR Interval:  142 QRS Duration: 80 QT Interval:  372 QTC Calculation: 439 R Axis:   19 Text Interpretation:  Normal sinus rhythm with sinus arrhythmia  Nonspecific ST abnormality Abnormal ECG agree. no old comparison.  Confirmed by Johnney Killian, MD, Jeannie Done [phone]) on 05/17/2016 3:38:04 PM      MDM   Final diagnoses:  Chest pain, unspecified chest pain type  Eructation   Pains held most consistent with gastric intestinal pain.No associated ischemic symptoms. Pending return of cardiac enzymes, plan will be for treatment with protonix for 2 weeks and follow up with PCP. Return precautions provided. Reviewed possible need for upper endoscopy if symptoms persist or other symptoms of dysphasia develop.    Charlesetta Shanks, MD 05/17/16 [phone]

## 2016-05-17 NOTE — ED Notes (Signed)
Pt reports pain across upper chest this morning that happened just after taking her synthroid.  States pain went away and she has been burping ever since.  Reports soreness to center of upper chest at this time.

## 2016-05-17 NOTE — Discharge Instructions (Signed)
Nonspecific Chest Pain  Chest pain can be caused by many different conditions. There is always a chance that your pain could be related to something serious, such as a heart attack or a blood clot in your lungs. Chest pain can also be caused by conditions that are not life-threatening. If you have chest pain, it is very important to follow up with your health care provider. CAUSES  Chest pain can be caused by:  Heartburn.  Pneumonia or bronchitis.  Anxiety or stress.  Inflammation around your heart (pericarditis) or lung (pleuritis or pleurisy).  A blood clot in your lung.  A collapsed lung (pneumothorax). It can develop suddenly on its own (spontaneous pneumothorax) or from trauma to the chest.  Shingles infection (varicella-zoster virus).  Heart attack.  Damage to the bones, muscles, and cartilage that make up your chest wall. This can include:  Bruised bones due to injury.  Strained muscles or cartilage due to frequent or repeated coughing or overwork.  Fracture to one or more ribs.  Sore cartilage due to inflammation (costochondritis). RISK FACTORS  Risk factors for chest pain may include:  Activities that increase your risk for trauma or injury to your chest.  Respiratory infections or conditions that cause frequent coughing.  Medical conditions or overeating that can cause heartburn.  Heart disease or family history of heart disease.  Conditions or health behaviors that increase your risk of developing a blood clot.  Having had chicken pox (varicella zoster). SIGNS AND SYMPTOMS Chest pain can feel like:  Burning or tingling on the surface of your chest or deep in your chest.  Crushing, pressure, aching, or squeezing pain.  Dull or sharp pain that is worse when you move, cough, or take a deep breath.  Pain that is also felt in your back, neck, shoulder, or arm, or pain that spreads to any of these areas. Your chest pain may come and go, or it may stay  constant. DIAGNOSIS Lab tests or other studies may be needed to find the cause of your pain. Your health care provider may have you take a test called an ambulatory ECG (electrocardiogram). An ECG records your heartbeat patterns at the time the test is performed. You may also have other tests, such as:  Transthoracic echocardiogram (TTE). During echocardiography, sound waves are used to create a picture of all of the heart structures and to look at how blood flows through your heart.  Transesophageal echocardiogram (TEE).This is a more advanced imaging test that obtains images from inside your body. It allows your health care provider to see your heart in finer detail.  Cardiac monitoring. This allows your health care provider to monitor your heart rate and rhythm in real time.  Holter monitor. This is a portable device that records your heartbeat and can help to diagnose abnormal heartbeats. It allows your health care provider to track your heart activity for several days, if needed.  Stress tests. These can be done through exercise or by taking medicine that makes your heart beat more quickly.  Blood tests.  Imaging tests. TREATMENT  Your treatment depends on what is causing your chest pain. Treatment may include:  Medicines. These may include:  Acid blockers for heartburn.  Anti-inflammatory medicine.  Pain medicine for inflammatory conditions.  Antibiotic medicine, if an infection is present.  Medicines to dissolve blood clots.  Medicines to treat coronary artery disease.  Supportive care for conditions that do not require medicines. This may include:  Resting.  Applying heat  or cold packs to injured areas.  Limiting activities until pain decreases. HOME CARE INSTRUCTIONS  If you were prescribed an antibiotic medicine, finish it all even if you start to feel better.  Avoid any activities that bring on chest pain.  Do not use any tobacco products, including  cigarettes, chewing tobacco, or electronic cigarettes. If you need help quitting, ask your health care provider.  Do not drink alcohol.  Take medicines only as directed by your health care provider.  Keep all follow-up visits as directed by your health care provider. This is important. This includes any further testing if your chest pain does not go away.  If heartburn is the cause for your chest pain, you may be told to keep your head raised (elevated) while sleeping. This reduces the chance that acid will go from your stomach into your esophagus.  Make lifestyle changes as directed by your health care provider. These may include:  Getting regular exercise. Ask your health care provider to suggest some activities that are safe for you.  Eating a heart-healthy diet. A registered dietitian can help you to learn healthy eating options.  Maintaining a healthy weight.  Managing diabetes, if necessary.  Reducing stress. SEEK MEDICAL CARE IF:  Your chest pain does not go away after treatment.  You have a rash with blisters on your chest.  You have a fever. SEEK IMMEDIATE MEDICAL CARE IF:   Your chest pain is worse.  You have an increasing cough, or you cough up blood.  You have severe abdominal pain.  You have severe weakness.  You faint.  You have chills.  You have sudden, unexplained chest discomfort.  You have sudden, unexplained discomfort in your arms, back, neck, or jaw.  You have shortness of breath at any time.  You suddenly start to sweat, or your skin gets clammy.  You feel nauseous or you vomit.  You suddenly feel light-headed or dizzy.  Your heart begins to beat quickly, or it feels like it is skipping beats. These symptoms may represent a serious problem that is an emergency. Do not wait to see if the symptoms will go away. Get medical help right away. Call your local emergency services (911 in the [place] Do not drive yourself to the hospital.   This  information is not intended to replace advice given to you by your health care provider. Make sure you discuss any questions you have with your health care provider.   Document Released: 08/12/2005 Document Revised: 11/23/2014 Document Reviewed: 06/08/2014 Elsevier Interactive Patient Education 2016 [employer] Suspected Gastroesophageal Reflux Disease/Esophageal spasm, Adult Normally, food travels down the esophagus and stays in the stomach to be digested. However, when a person has gastroesophageal reflux disease (GERD), food and stomach acid move back up into the esophagus. When this happens, the esophagus becomes sore and inflamed. Over time, GERD can create small holes (ulcers) in the lining of the esophagus.  CAUSES This condition is caused by a problem with the muscle between the esophagus and the stomach (lower esophageal sphincter, or LES). Normally, the LES muscle closes after food passes through the esophagus to the stomach. When the LES is weakened or abnormal, it does not close properly, and that allows food and stomach acid to go back up into the esophagus. The LES can be weakened by certain dietary substances, medicines, and medical conditions, including:  Tobacco use.  Pregnancy.  Having a hiatal hernia.  Heavy alcohol use.  Certain foods and beverages, such as  coffee, chocolate, onions, and peppermint. RISK FACTORS This condition is more likely to develop in:  People who have an increased body weight.  People who have connective tissue disorders.  People who use NSAID medicines. SYMPTOMS Symptoms of this condition include:  Heartburn.  Difficult or painful swallowing.  The feeling of having a lump in the throat.  Abitter taste in the mouth.  Bad breath.  Having a large amount of saliva.  Having an upset or bloated stomach.  Belching.  Chest pain.  Shortness of breath or wheezing.  Ongoing (chronic) cough or a night-time cough.  Wearing away of  tooth enamel.  Weight loss. Different conditions can cause chest pain. Make sure to see your health care provider if you experience chest pain. DIAGNOSIS Your health care provider will take a medical history and perform a physical exam. To determine if you have mild or severe GERD, your health care provider may also monitor how you respond to treatment. You may also have other tests, including:  An endoscopy toexamine your stomach and esophagus with a small camera.  A test thatmeasures the acidity level in your esophagus.  A test thatmeasures how much pressure is on your esophagus.  A barium swallow or modified barium swallow to show the shape, size, and functioning of your esophagus. TREATMENT The goal of treatment is to help relieve your symptoms and to prevent complications. Treatment for this condition may vary depending on how severe your symptoms are. Your health care provider may recommend:  Changes to your diet.  Medicine.  Surgery. HOME CARE INSTRUCTIONS Diet  Follow a diet as recommended by your health care provider. This may involve avoiding foods and drinks such as:  Coffee and tea (with or without caffeine).  Drinks that containalcohol.  Energy drinks and sports drinks.  Carbonated drinks or sodas.  Chocolate and cocoa.  Peppermint and mint flavorings.  Garlic and onions.  Horseradish.  Spicy and acidic foods, including peppers, chili powder, curry powder, vinegar, hot sauces, and barbecue sauce.  Citrus fruit juices and citrus fruits, such as oranges, lemons, and limes.  Tomato-based foods, such as red sauce, chili, salsa, and pizza with red sauce.  Fried and fatty foods, such as donuts, french fries, potato chips, and high-fat dressings.  High-fat meats, such as hot dogs and fatty cuts of red and white meats, such as rib eye steak, sausage, ham, and bacon.  High-fat dairy items, such as whole milk, butter, and cream cheese.  Eat small,  frequent meals instead of large meals.  Avoid drinking large amounts of liquid with your meals.  Avoid eating meals during the 2-3 hours before bedtime.  Avoid lying down right after you eat.  Do not exercise right after you eat. General Instructions  Pay attention to any changes in your symptoms.  Take over-the-counter and prescription medicines only as told by your health care provider. Do not take aspirin, ibuprofen, or other NSAIDs unless your health care provider told you to do so.  Do not use any tobacco products, including cigarettes, chewing tobacco, and e-cigarettes. If you need help quitting, ask your health care provider.  Wear loose-fitting clothing. Do not wear anything tight around your waist that causes pressure on your abdomen.  Raise (elevate) the head of your bed 6 inches (15cm).  Try to reduce your stress, such as with yoga or meditation. If you need help reducing stress, ask your health care provider.  If you are overweight, reduce your weight to an amount that  is healthy for you. Ask your health care provider for guidance about a safe weight loss goal.  Keep all follow-up visits as told by your health care provider. This is important. SEEK MEDICAL CARE IF:  You have new symptoms.  You have unexplained weight loss.  You have difficulty swallowing, or it hurts to swallow.  You have wheezing or a persistent cough.  Your symptoms do not improve with treatment.  You have a hoarse voice. SEEK IMMEDIATE MEDICAL CARE IF:  You have pain in your arms, neck, jaw, teeth, or back.  You feel sweaty, dizzy, or light-headed.  You have chest pain or shortness of breath.  You vomit and your vomit looks like blood or coffee grounds.  You faint.  Your stool is bloody or black.  You cannot swallow, drink, or eat.   This information is not intended to replace advice given to you by your health care provider. Make sure you discuss any questions you have with  your health care provider.   Document Released: 08/12/2005 Document Revised: 07/24/2015 Document Reviewed: 02/27/2015 Elsevier Interactive Patient Education 2016 [employer] Esophageal Spasm An esophageal spasm is a muscle spasm of the tube that connects the back of your throat to your stomach (esophagus). Your esophagus normally moves food and liquids down into your stomach with smooth, wavelike muscle contractions. If you have esophageal spasms, abnormal muscle contractions in your esophagus can be painful and cause you to have difficulty swallowing (dysphagia). There are two types of esophageal spasms. You may have one or both types:  Diffuse esophageal spasms. These are irregular, uncoordinated muscle movements of the esophagus. The diffuse type causes more dysphagia.  Nutcracker esophagus. This is a type of muscle contraction that is coordinated but too strong. The muscles move in a regular order, but the contraction is stronger than necessary. The nutcracker type of esophageal spasm is more painful. Severe cases of esophageal spasm can make it hard to eat well and do all your usual activities. Esophageal spasms often occur along with severe heartburn (reflux esophagitis). Swallowed foods or liquids may also come back up into your throat (regurgitation).  CAUSES  The cause of esophageal spasm is not known. RISK FACTORS You may have a higher risk for esophageal spasm if you:  Are female.  Are an older person.  Eat very quickly.  Swallow foods or drinks that are very hot or very cold.  Are depressed or anxious. SIGNS AND SYMPTOMS  Signs and symptoms can vary from day to day. They may be mild or severe. They may last for minutes or hours. Common signs and symptoms include:  Chest pain. This may feel like a heart attack.  Back pain.  Dysphagia.  Heartburn.  The feeling that something is stuck in your throat (globus).  Regurgitation of foods or liquids. DIAGNOSIS  Your  health care provider may suspect esophageal spasm based on your symptoms and a physical exam. Tests may be done to help confirm the diagnosis. These may include:  Endoscopy. A flexible telescope is passed into your esophagus.  Barium swallow. An X-ray is done while you drink a substance (contrast material) that shows up on X-ray.  Esophageal manometry. Pressure measurements of the inside of your esophagus are taken while you swallow. TREATMENT  Mild cases of esophageal spasms may not need to be treated. You may be able to manage the spasms by avoiding the foods and eating habits that trigger them. Treatment for spasms that are more severe or frequent can  include the following:  You may be given medicine to:  Relax the esophageal muscles.  Relieve muscle spasms (calcium channel blockers and nitrates).  Block nerve endings in the esophagus. This is done with an injection of a certain toxin (botulinum).  Relieve heartburn (proton pump inhibitors).  Antidepressant medicines are sometimes used to ease symptoms.  For severe cases, surgery is sometimes done to reduce esophageal muscle contractions (myotomy). HOME CARE INSTRUCTIONS   Take medicines only as directed by your health care provider.  Do not eat foods that trigger heartburn or esophageal spasms.  Eat your meals slowly.  Chew your food completely.  Avoid foods and drinks that are very hot or very cold.  Find ways to manage stress.  Ask for help if you struggle with depression or anxiety.  Keep all follow-up visits as directed by your health care provider. This is important. SEEK MEDICAL CARE IF:   Your symptoms change or get worse.  You are losing weight because of dysphagia.  Your medicine is not helping your symptoms.  Your esophageal spasms are interfering with your quality of life. SEEK IMMEDIATE MEDICAL CARE IF:   You have very bad or unusual chest pain.  You have trouble breathing.  You choke. MAKE SURE  YOU:  Understand these instructions.  Will watch your condition.  Will get help right away if you are not doing well or get worse.   This information is not intended to replace advice given to you by your health care provider. Make sure you discuss any questions you have with your health care provider.   Document Released: 01/23/2003 Document Revised: 11/23/2014 Document Reviewed: 01/26/2014 Elsevier Interactive Patient Education [employer].

## 2016-05-17 NOTE — ED Provider Notes (Signed)
CSN: [id number]     Arrival date & time 05/17/16  1503 History   First MD Initiated Contact with Patient 05/17/16 1538     Chief Complaint  Patient presents with  . Chest Pain     (Consider location/radiation/quality/duration/timing/severity/associated sxs/prior Treatment) HPI  Past Medical History  Diagnosis Date  . Cancer (HCC)     undifferentiated thryoid  . Thyroid disease    Past Surgical History  Procedure Laterality Date  . Thyroidectomy      2005  . G0p0     Family History  Problem Relation Age of Onset  . Hypertension Father   . Diabetes Father   . Hypothyroidism Father   . Cancer Father     prostate   Social History  Substance Use Topics  . Smoking status: Former Smoker    Quit date: 08/13/1986  . Smokeless tobacco: Never Used  . Alcohol Use: No   OB History    No data available     Review of Systems    Allergies  Review of patient's allergies indicates no known allergies.  Home Medications   Prior to Admission medications   Medication Sig Start Date End Date Taking? Authorizing Provider  pantoprazole (PROTONIX) 20 MG tablet Take 1 tablet (20 mg total) by mouth daily. 05/17/16   Charlesetta Shanks, MD  SYNTHROID 125 MCG tablet Take 1 tablet (125 mcg total) by mouth daily before breakfast. 02/04/16   Philemon Kingdom, MD   BP 162/88 mmHg  Pulse 88  Temp(Src) 98.3 F (36.8 C) (Oral)  Resp 14  SpO2 100%  LMP 05/03/2016 Physical Exam  ED Course  Procedures (including critical care time) Labs Review Labs Reviewed  BASIC METABOLIC PANEL - Abnormal; Notable for the following:    CO2 19 (*)    Glucose, Bld 108 (*)    Creatinine, Ser 1.03 (*)    All other components within normal limits  CBC - Abnormal; Notable for the following:    Hemoglobin 11.0 (*)    HCT 34.4 (*)    RDW 15.9 (*)    All other components within normal limits  Randolm Idol, ED    Imaging Review Dg Chest 2 View  05/17/2016  CLINICAL DATA:  Hervey Ard RIGHT chest pain,  indigestion after taking Synthroid today. History of thyroid cancer. EXAM: CHEST  2 VIEW COMPARISON:  None. FINDINGS: Low inspiratory examination. Cardiomediastinal silhouette is normal. No pleural effusion or focal consolidation. No pneumothorax. Soft tissue planes and included osseous structure nonsuspicious. Mild broad lower thoracic dextroscoliosis. IMPRESSION: No acute cardiopulmonary process for this low inspiratory examination. Electronically Signed   By: Elon Alas M.D.   On: 05/17/2016 16:09   I have personally reviewed and evaluated these images and lab results as part of my medical decision-making.   EKG Interpretation   Date/Time:  Sunday May 17 2016 15:07:43 EDT Ventricular Rate:  84 PR Interval:  142 QRS Duration: 80 QT Interval:  372 QTC Calculation: 439 R Axis:   19 Text Interpretation:  Normal sinus rhythm with sinus arrhythmia  Nonspecific ST abnormality Abnormal ECG agree. no old comparison.  Confirmed by Johnney Killian, MD, Jeannie Done [id number]) on 05/17/2016 3:38:04 PM      MDM  PT RE EVALUATED.  SHE FEELS BETTER.  SHE KNOWS TO RETURN IF WORSE. Final diagnoses:  Chest pain, unspecified chest pain type  Eructation        Isla Pence, MD 05/17/16 1735

## 2016-05-18 ENCOUNTER — Encounter: Payer: Self-pay | Admitting: Internal Medicine

## 2016-08-20 ENCOUNTER — Ambulatory Visit (INDEPENDENT_AMBULATORY_CARE_PROVIDER_SITE_OTHER): Payer: BLUE CROSS/BLUE SHIELD

## 2016-08-20 DIAGNOSIS — Z23 Encounter for immunization: Secondary | ICD-10-CM

## 2016-11-27 ENCOUNTER — Ambulatory Visit: Payer: BLUE CROSS/BLUE SHIELD | Admitting: Internal Medicine

## 2017-01-06 ENCOUNTER — Ambulatory Visit: Payer: BLUE CROSS/BLUE SHIELD | Admitting: Internal Medicine

## 2017-01-11 ENCOUNTER — Encounter: Payer: Self-pay | Admitting: Internal Medicine

## 2017-01-11 ENCOUNTER — Ambulatory Visit (INDEPENDENT_AMBULATORY_CARE_PROVIDER_SITE_OTHER): Payer: BLUE CROSS/BLUE SHIELD | Admitting: Internal Medicine

## 2017-01-11 VITALS — BP 130/82 | HR 68 | Wt 254.0 lb

## 2017-01-11 DIAGNOSIS — E032 Hypothyroidism due to medicaments and other exogenous substances: Secondary | ICD-10-CM | POA: Diagnosis not present

## 2017-01-11 DIAGNOSIS — C73 Malignant neoplasm of thyroid gland: Secondary | ICD-10-CM

## 2017-01-11 LAB — T4, FREE: Free T4: 1.04 ng/dL (ref 0.60–1.60)

## 2017-01-11 LAB — TSH: TSH: 0.17 u[IU]/mL — ABNORMAL LOW (ref 0.35–4.50)

## 2017-01-11 NOTE — Progress Notes (Signed)
Patient ID: Shelly Baldwin, female   DOB: 1966-02-17, 51 y.o.   MRN: [id number]   HPI  Shelly Baldwin is a 51 y.o.-year-old female, returning for f/u for thyroid cancer and postsurgical hypothyroidism. Last visit 1 year ago.  She had an esophageal spasm in 05/2016.  Reviewed hx: Pt. has been found to have a goiter with neck compression sxs, then had Bx >> inconclusive >> total thyroidectomy >> dx with thyroid cancer in 2004, had total thyroidectomy in 2005.   She remembers having had RAI tx and a WBS. She had rTSH - stim WBS every year for 5 years >> no mets (up to 2010).  She has postsurgical hypothyroidism; is on Synthroid (brand) 125 mcg (changed 09/2014), taken: - fasting - with water - separated by >1h from b'fast  - no calcium, iron, PPIs, multivitamins   I reviewed pt's thyroid tests: Lab Results  Component Value Date   TSH 2.26 11/29/2015   TSH 0.42 11/26/2014   TSH 0.011 (L) 09/26/2014   TSH 0.01 (L) 03/22/2014   TSH 4.06 11/24/2013   TSH 0.21 (L) 08/04/2012   TSH 0.34 (L) 08/14/2011   FREET4 0.86 11/29/2015   FREET4 0.95 11/26/2014   FREET4 1.59 09/26/2014   FREET4 1.50 03/22/2014   FREET4 0.70 11/24/2013   FREET4 0.79 08/14/2011    Abs and Tg: Lab Results  Component Value Date   THYROGLB <2.0 11/29/2015   THYROGLB <2.0 09/26/2014   THYROGLB <0.2 11/24/2013   THYROGLB <0.2 08/14/2011   Lab Results  Component Value Date   THGAB <1.0 11/29/2015   THGAB <1.0 09/26/2014   THGAB 485.0 (H) 11/24/2013   THGAB <20.0 08/14/2011   From now on - need to send Tg + ATA to [facility].  Pt denies feeling nodules in neck, hoarseness, dysphagia/odynophagia, SOB with lying down.  Pt denies hypothyroid sxs: - weight gain - cold intolerance - fatigue - constipation - dry skin - hair loss - depression  ROS: Constitutional: no weight loss/gain, no fatigue, no subjective hyperthermia/hypothermia Eyes: no blurry vision, no xerophthalmia ENT: no sore throat, no nodules  palpated in throat, no dysphagia/odynophagia, no hoarseness Cardiovascular: no CP/SOB/palpitations/leg swelling Respiratory: no cough/SOB Gastrointestinal: no N/V/D/C Musculoskeletal: no muscle/joint aches Skin: no rashes Neurological: no tremors/numbness/tingling/dizziness  I reviewed pt's medications, allergies, PMH, social hx, family hx, and changes were documented in the history of present illness. Otherwise, unchanged from my initial visit note:  Past Medical History:  Diagnosis Date  . Cancer (HCC)    undifferentiated thryoid  . Thyroid disease    Past Surgical History:  Procedure Laterality Date  . G0P0    . THYROIDECTOMY     2005   History   Social History  . Marital Status: Single    Spouse Name: N/A    Number of Children: 0  . Years of Education: 14   Occupational History  . technical support    Social History Main Topics  . Smoking status: Former Smoker    Quit date: 08/13/1986  . Smokeless tobacco: Never Used  . Alcohol Use: No  . Drug Use: No   Social History Narrative   HSG, [employer] in [place], matriculating [employer] (Sept '12). Single. Work - Loss adjuster, chartered at [employer]. \Do you exercise at least 3 times a week- twice a week with walking. Is there a smoke alarm in your house- yes. Are there guns and firearms in your household- no . Have you experienced physical abuse-no  Usual # of hours of sleep per night 6-7 hours per night   Number of people living at your residence- Pt lives alone   Current Outpatient Prescriptions on File Prior to Visit  Medication Sig Dispense Refill  . SYNTHROID 125 MCG tablet Take 1 tablet (125 mcg total) by mouth daily before breakfast. 90 tablet 3  . pantoprazole (PROTONIX) 20 MG tablet Take 1 tablet (20 mg total) by mouth daily. (Patient not taking: Reported on 01/11/2017) 30 tablet 0   No current facility-administered medications on file prior to visit.    No Known Allergies Family History  Problem  Relation Age of Onset  . Hypertension Father   . Diabetes Father   . Hypothyroidism Father   . Cancer Father     prostate   PE: BP 130/82 (BP Location: Left Arm, Patient Position: Sitting)   Pulse 68   Wt 254 lb (115.2 kg)   LMP 12/07/2016   SpO2 98%   BMI 41.62 kg/m  Body mass index is 41.62 kg/m. Wt Readings from Last 3 Encounters:  01/11/17 254 lb (115.2 kg)  12/16/15 255 lb (115.7 kg)  11/29/15 261 lb 12.8 oz (118.8 kg)   Constitutional: obese, in NAD Eyes: PERRLA, EOMI, no exophthalmos ENT: moist mucous membranes, no thyromegaly, no cervical lymphadenopathy, healed thyroid scar with a little cheloid Cardiovascular: RRR, No MRG Respiratory: CTA B Gastrointestinal: abdomen soft, NT, ND, BS+ Musculoskeletal: no deformities, strength intact in all 4 Skin: moist, warm, no rashes Neurological: no tremor with outstretched hands, DTR normal in all 4  ASSESSMENT: 1. Postsurgical Hypothyroidism - on Brand name Synthroid  2. Thyroid Cancer (stage 1 or 2) in remission  PLAN:  1. Patient with long-standing iatrogenic hypothyroidism after surgery for thyroid cancer -  on levothyroxine therapy (Synthroid 125 mcg daily). She appears euthyroid. - We discussed about correct intake of levothyroxine, fasting, with water, separated by at least 30 minutes from breakfast, and separated by more than 4 hours from calcium, iron, multivitamins, acid reflux medications (PPIs). She is taking it correctly. Not missing doses anymore. - she has a h/o suppressed TSH levels >> now aiming for normal TSH - She does not appear to have enlarged LNs or any palpable thyroid mass  - we'll check thyroid tests today: TSH, free T4 - If these are abnormal, she will need to return in 5-6 weeks for repeat labs - If these are normal, I will see her back in 1 year.   2. PTC - reviewed hx - WBS's negative up to 2010; undetectable Tg since then >> no signs and recurrence - will check Tg and ATA ([facility]) today -  Plan to check a neck U/S in 10 years after last WBS >> in 2 years from now  Needs refills. Component     Latest Ref Rng & Units 01/11/2017  TSH     0.35 - 4.50 uIU/mL 0.17 (L)  T4,Free(Direct)     0.60 - 1.60 ng/dL 1.04  Thyroglobulin Antibody     0.0 - 0.9 IU/mL <1.0  Thyroglobulin by IMA     1.5 - 38.5 ng/mL <0.1 (L)   Thyroglobulin and thyroglobulin antibodies are great!  TSH is lower than target, will decrease the dose to 112 g daily. She will need to come back for labs in 6 weeks.  Philemon Kingdom, MD PhD [facility] Endocrinology

## 2017-01-11 NOTE — Patient Instructions (Signed)
Please stop at the lab.  Continue Synthroid 125 mcg daily.  Take the thyroid hormone every day, with water, at least 30 minutes before breakfast, separated by at least 4 hours from: - acid reflux medications - calcium - iron - multivitamins  Please return in 1 year.  

## 2017-01-13 LAB — THYROGLOBULIN BY IMA: Thyroglobulin by IMA: <0.1 ng/mL — ABNORMAL LOW (ref 1.5–38.5)

## 2017-01-13 LAB — TGAB+THYROGLOBULIN IMA OR LCMS: Thyroglobulin Antibody: <1 IU/mL (ref 0.0–0.9)

## 2017-01-13 MED ORDER — SYNTHROID 112 MCG PO TABS: 112.0000 ug | ORAL_TABLET | Freq: Every day | ORAL | 2 refills | Status: DC

## 2017-03-22 ENCOUNTER — Other Ambulatory Visit: Payer: Self-pay

## 2017-03-22 DIAGNOSIS — E032 Hypothyroidism due to medicaments and other exogenous substances: Secondary | ICD-10-CM

## 2017-03-22 MED ORDER — SYNTHROID 112 MCG PO TABS: 112.0000 ug | ORAL_TABLET | Freq: Every day | ORAL | 1 refills | Status: DC

## 2017-03-24 ENCOUNTER — Ambulatory Visit (INDEPENDENT_AMBULATORY_CARE_PROVIDER_SITE_OTHER): Payer: BLUE CROSS/BLUE SHIELD | Admitting: Internal Medicine

## 2017-03-24 ENCOUNTER — Encounter: Payer: Self-pay | Admitting: Internal Medicine

## 2017-03-24 ENCOUNTER — Other Ambulatory Visit (INDEPENDENT_AMBULATORY_CARE_PROVIDER_SITE_OTHER): Payer: BLUE CROSS/BLUE SHIELD

## 2017-03-24 VITALS — BP 132/86 | HR 53 | Temp 98.3°F | Resp 16 | Wt 253.0 lb

## 2017-03-24 DIAGNOSIS — N921 Excessive and frequent menstruation with irregular cycle: Secondary | ICD-10-CM | POA: Insufficient documentation

## 2017-03-24 LAB — CBC WITH DIFFERENTIAL/PLATELET
BASOS ABS: 0.1 10*3/uL (ref 0.0–0.1)
Basophils Relative: 1.3 % (ref 0.0–3.0)
Eosinophils Absolute: 0.1 10*3/uL (ref 0.0–0.7)
Eosinophils Relative: 3.6 % (ref 0.0–5.0)
HCT: 33 % — ABNORMAL LOW (ref 36.0–46.0)
HEMOGLOBIN: 10.8 g/dL — AB (ref 12.0–15.0)
Lymphocytes Relative: 22.9 % (ref 12.0–46.0)
Lymphs Abs: 0.9 10*3/uL (ref 0.7–4.0)
MCHC: 32.7 g/dL (ref 30.0–36.0)
MCV: 83.8 fl (ref 78.0–100.0)
Monocytes Absolute: 0.4 10*3/uL (ref 0.1–1.0)
Monocytes Relative: 9.6 % (ref 3.0–12.0)
Neutro Abs: 2.5 10*3/uL (ref 1.4–7.7)
Neutrophils Relative %: 62.6 % (ref 43.0–77.0)
Platelets: 285 10*3/uL (ref 150.0–400.0)
RBC: 3.94 Mil/uL (ref 3.87–5.11)
RDW: 17.8 % — ABNORMAL HIGH (ref 11.5–15.5)
WBC: 4 10*3/uL (ref 4.0–10.5)

## 2017-03-24 LAB — COMPREHENSIVE METABOLIC PANEL
ALK PHOS: 72 U/L (ref 39–117)
ALT: 10 U/L (ref 0–35)
AST: 15 U/L (ref 0–37)
Albumin: 4.6 g/dL (ref 3.5–5.2)
BUN: 12 mg/dL (ref 6–23)
CO2: 27 mEq/L (ref 19–32)
CREATININE: 1.05 mg/dL (ref 0.40–1.20)
Calcium: 9.5 mg/dL (ref 8.4–10.5)
Chloride: 105 mEq/L (ref 96–112)
GFR: 58.59 mL/min — ABNORMAL LOW (ref >60.00)
Glucose, Bld: 91 mg/dL (ref 70–99)
Potassium: 4.1 mEq/L (ref 3.5–5.1)
SODIUM: 138 meq/L (ref 135–145)
TOTAL PROTEIN: 7.4 g/dL (ref 6.0–8.3)
Total Bilirubin: 0.5 mg/dL (ref 0.2–1.2)

## 2017-03-24 LAB — PROLACTIN: Prolactin: 7 ng/mL

## 2017-03-24 LAB — HCG, SERUM, QUALITATIVE: PREG SERUM: NEGATIVE

## 2017-03-24 LAB — TSH: TSH: 1.65 u[IU]/mL (ref 0.35–4.50)

## 2017-03-24 LAB — FSH/LH
FSH: 17.9 m[IU]/mL
LH: 6 m[IU]/mL

## 2017-03-24 NOTE — Progress Notes (Signed)
Subjective:    Patient ID: Shelly Baldwin, female    DOB: 03-Mar-1966, 51 y.o.   MRN: [id number]  HPI She is here for an acute visit.   Her menstrual cycle has been going on for three week. The flow was very light until yesterday it started to be very heavy.  Three months ago she had a missed period.  They have been normal other than that. She denies hot flashes.  She denies abdominal cramping/pain.   She did call her gyn and has an appt in June.   She sees Dr Edwyna Ready.    Her mother started menopause at 27.  There is no personal or family history of fibroids.     Medications and allergies reviewed with patient and updated if appropriate.  Patient Active Problem List   Diagnosis Date Noted  . Borderline hypertension 09/17/2015  . Obesity, Class I, BMI 30.0-34.9 (see actual BMI) 12/15/2013  . Hypothyroidism, iatrogenic 08/04/2012  . Thyroid cancer [facility])     Current Outpatient Prescriptions on File Prior to Visit  Medication Sig Dispense Refill  . fluticasone (FLONASE) 50 MCG/ACT nasal spray Place into both nostrils daily.    . Loratadine (CLARITIN PO) Take by mouth daily.    . pantoprazole (PROTONIX) 20 MG tablet Take 1 tablet (20 mg total) by mouth daily. 30 tablet 0  . SYNTHROID 112 MCG tablet Take 1 tablet (112 mcg total) by mouth daily before breakfast. 90 tablet 1   No current facility-administered medications on file prior to visit.     Past Medical History:  Diagnosis Date  . Cancer (HCC)    undifferentiated thryoid  . Thyroid disease     Past Surgical History:  Procedure Laterality Date  . G0P0    . THYROIDECTOMY     2005    Social History   Social History  . Marital status: Single    Spouse name: N/A  . Number of children: 0  . Years of education: 14   Occupational History  . technical support    Social History Main Topics  . Smoking status: Former Smoker    Quit date: 08/13/1986  . Smokeless tobacco: Never Used  . Alcohol use No  . Drug use: No    . Sexual activity: Not on file   Other Topics Concern  . Not on file   Social History Narrative   HSG, [employer] in [place], matriculating [employer] (Sept '12). Single. Work - Loss adjuster, chartered at [employer]. \Do you exercise at least 3 times a week- twice a week with walking. Is there a smoke alarm in your house- yes. Are there guns and firearms in your household- no . Have you experienced physical abuse-no         Usual # of hours of sleep per night 6-7 hours per night   Number of people living at your residence- Pt lives alone                      Family History  Problem Relation Age of Onset  . Hypertension Father   . Diabetes Father   . Hypothyroidism Father   . Cancer Father     prostate    Review of Systems  Constitutional: Negative for chills, diaphoresis, fatigue and fever.  Cardiovascular: Negative for chest pain and palpitations.  Gastrointestinal: Negative for abdominal pain.  Neurological: Negative for light-headedness and headaches.       Objective:   Vitals:   03/24/17 0845  BP: 132/86  Pulse: (!) 53  Resp: 16  Temp: 98.3 F (36.8 C)   Filed Weights   03/24/17 0845  Weight: 253 lb (114.8 kg)   Body mass index is 41.46 kg/m.  Wt Readings from Last 3 Encounters:  03/24/17 253 lb (114.8 kg)  01/11/17 254 lb (115.2 kg)  12/16/15 255 lb (115.7 kg)     Physical Exam Constitutional: Appears well-developed and well-nourished. No distress.  HENT:  Head: Normocephalic and atraumatic.  Neck: Neck supple. No tracheal deviation present. No thyromegaly present.  No cervical lymphadenopathy Cardiovascular: Normal rate, regular rhythm and normal heart sounds.   No murmur heard. No carotid bruit .  No edema Pulmonary/Chest: Effort normal and breath sounds normal. No respiratory distress. No has no wheezes. No rales.  Abdomen: soft, obese, non tender, non distended Skin: Skin is warm and dry. Not diaphoretic.  Psychiatric: Normal mood and affect.  Behavior is normal.         Assessment & Plan:   See Problem List for Assessment and Plan of chronic medical problems.

## 2017-03-24 NOTE — Progress Notes (Signed)
Pre visit review using our clinic review tool, if applicable. No additional management support is needed unless otherwise documented below in the visit note. 

## 2017-03-24 NOTE — Patient Instructions (Signed)
  Test(s) ordered today. Your results will be released to [place] (or called to you) after review, usually within 72hours after test completion. If any changes need to be made, you will be notified at that same time.  Someone will contact you to schedule the ultrasound.   Medications reviewed and updated.   No changes recommended at this time.  Follow up with your gyn   Menopause treatments: Estrogen therapy, progestin  Relaxation-paced respiration Exercise and weight loss SSRIs, SSRIs-Prozac, Lexapro, Effexor Gabapentin Clonidine transdermal  Acupuncture Black cohosh Evening primrose oil Soy products Phyto-estrogens-nonsteroidal components that occur naturally in plants, fruits and vegetables

## 2017-03-24 NOTE — Assessment & Plan Note (Signed)
Likely menopause, need to rule out fibroids or endometrial thickening Has gyn appt next month Will check US - pelvic, transvaginal Check hormones labs and rule out anemia, which is not likely since menses just got heavy

## 2017-03-27 LAB — ESTROGENS, TOTAL: Estrogen: 182.7 pg/mL

## 2017-03-28 ENCOUNTER — Encounter: Payer: Self-pay | Admitting: Internal Medicine

## 2017-04-06 ENCOUNTER — Ambulatory Visit
Admission: RE | Admit: 2017-04-06 | Discharge: 2017-04-06 | Disposition: A | Discharge status: Home / Self Care | Payer: BLUE CROSS/BLUE SHIELD | Source: Ambulatory Visit | Attending: Internal Medicine | Admitting: Internal Medicine

## 2017-04-06 DIAGNOSIS — N921 Excessive and frequent menstruation with irregular cycle: Secondary | ICD-10-CM

## 2017-05-07 ENCOUNTER — Other Ambulatory Visit: Payer: Self-pay | Admitting: Obstetrics and Gynecology

## 2017-05-12 LAB — HM PAP SMEAR: HM Pap smear: NEGATIVE

## 2017-08-28 ENCOUNTER — Ambulatory Visit (INDEPENDENT_AMBULATORY_CARE_PROVIDER_SITE_OTHER): Payer: BLUE CROSS/BLUE SHIELD

## 2017-08-28 DIAGNOSIS — Z23 Encounter for immunization: Secondary | ICD-10-CM | POA: Diagnosis not present

## 2017-09-21 ENCOUNTER — Other Ambulatory Visit: Payer: Self-pay | Admitting: Internal Medicine

## 2017-09-21 DIAGNOSIS — Z1231 Encounter for screening mammogram for malignant neoplasm of breast: Secondary | ICD-10-CM

## 2017-10-18 ENCOUNTER — Ambulatory Visit
Admission: RE | Admit: 2017-10-18 | Discharge: 2017-10-18 | Disposition: A | Discharge status: Home / Self Care | Payer: BLUE CROSS/BLUE SHIELD | Source: Ambulatory Visit | Attending: Internal Medicine | Admitting: Internal Medicine

## 2017-10-18 DIAGNOSIS — Z1231 Encounter for screening mammogram for malignant neoplasm of breast: Secondary | ICD-10-CM

## 2017-12-14 ENCOUNTER — Other Ambulatory Visit: Payer: Self-pay | Admitting: Internal Medicine

## 2017-12-14 DIAGNOSIS — E032 Hypothyroidism due to medicaments and other exogenous substances: Secondary | ICD-10-CM

## 2018-01-11 ENCOUNTER — Encounter: Payer: Self-pay | Admitting: Internal Medicine

## 2018-01-11 ENCOUNTER — Ambulatory Visit: Payer: BLUE CROSS/BLUE SHIELD | Admitting: Internal Medicine

## 2018-01-11 VITALS — BP 148/90 | HR 78 | Ht 65.5 in | Wt 240.8 lb

## 2018-01-11 DIAGNOSIS — E032 Hypothyroidism due to medicaments and other exogenous substances: Secondary | ICD-10-CM

## 2018-01-11 DIAGNOSIS — C73 Malignant neoplasm of thyroid gland: Secondary | ICD-10-CM

## 2018-01-11 NOTE — Progress Notes (Signed)
Patient ID: Shelly Baldwin, female   DOB: [birth year], 52 y.o.   MRN: [id number]   HPI  Shelly Baldwin is a 52 y.o.-year-old female, returning for f/u for thyroid cancer and postsurgical hypothyroidism. Last visit 1 year ago.  Reviewed history: Pt. has been found to have a goiter with neck compression symptoms sxs, then had Bx >> inconclusive >> total thyroidectomy >> dx with thyroid cancer in 2004, had total thyroidectomy in 2005.  She remembers having had RAI tx and a WBS. She had rTSH - stim WBS every year for 5 years >> no mets (up to 2010)  Pt is on  Synthroid 112 mcg (decreased at last visit) mcg daily, taken: - in am - fasting - at least 1h from b'fast - no Ca, Fe, MVI, PPIs - not on Biotin  Latest TSH was normal after decreasing the dose: Lab Results  Component Value Date   TSH 1.65 03/24/2017   TSH 0.17 (L) 01/11/2017   TSH 2.26 11/29/2015   TSH 0.42 11/26/2014   TSH 0.011 (L) 09/26/2014   TSH 0.01 (L) 03/22/2014   TSH 4.06 11/24/2013   TSH 0.21 (L) 08/04/2012   TSH 0.34 (L) 08/14/2011   FREET4 1.04 01/11/2017   FREET4 0.86 11/29/2015   FREET4 0.95 11/26/2014   FREET4 1.59 09/26/2014   FREET4 1.50 03/22/2014   FREET4 0.70 11/24/2013   FREET4 0.79 08/14/2011    Her thyroglobulin has been undetectable: Lab Results  Component Value Date   THYROGLB <0.1 (L) 01/11/2017   THYROGLB <2.0 11/29/2015   THYROGLB <2.0 09/26/2014   THYROGLB <0.2 11/24/2013   THYROGLB <0.2 08/14/2011   She had one instance of elevated ATA antibodies: Lab Results  Component Value Date   THGAB <1.0 01/11/2017   THGAB <1.0 11/29/2015   THGAB <1.0 09/26/2014   THGAB 485.0 (H) 11/24/2013   THGAB <20.0 08/14/2011  Need to send Tg + ATA to [facility].  Pt denies: - feeling nodules in neck - hoarseness - dysphagia - choking - SOB with lying down  ROS: Constitutional: no weight gain/no weight loss, no fatigue, + subjective hyperthermia (hot flushes), no subjective hypothermia Eyes: no blurry  vision, no xerophthalmia ENT: no sore throat, + see HPI Cardiovascular: no CP/no SOB/no palpitations/no leg swelling Respiratory: no cough/no SOB/no wheezing Gastrointestinal: no N/no V/no D/no C/no acid reflux Musculoskeletal: no muscle aches/no joint aches Skin: no rashes, no hair loss Neurological: no tremors/no numbness/no tingling/no dizziness  I reviewed pt's medications, allergies, PMH, social hx, family hx, and changes were documented in the history of present illness. Otherwise, unchanged from my initial visit note.  Past Medical History:  Diagnosis Date  . Cancer (HCC)    undifferentiated thryoid  . Thyroid disease    Past Surgical History:  Procedure Laterality Date  . G0P0    . THYROIDECTOMY     2005   History   Social History  . Marital Status: Single    Spouse Name: N/A    Number of Children: 0  . Years of Education: 14   Occupational History  . technical support    Social History Main Topics  . Smoking status: Former Smoker    Quit date: 08/13/1986  . Smokeless tobacco: Never Used  . Alcohol Use: No  . Drug Use: No   Social History Narrative   HSG, [employer] in [place], matriculating [employer] (Sept '12). Single. Work - Loss adjuster, chartered at [employer]. \Do you exercise at least 3 times a week- twice a week with  walking. Is there a smoke alarm in your house- yes. Are there guns and firearms in your household- no . Have you experienced physical abuse-no         Usual # of hours of sleep per night 6-7 hours per night   Number of people living at your residence- Pt lives alone   Current Outpatient Medications on File Prior to Visit  Medication Sig Dispense Refill  . fluticasone (FLONASE) 50 MCG/ACT nasal spray Place into both nostrils daily.    . Loratadine (CLARITIN PO) Take by mouth daily.    . pantoprazole (PROTONIX) 20 MG tablet Take 1 tablet (20 mg total) by mouth daily. 30 tablet 0  . SYNTHROID 112 MCG tablet TAKE 1 TABLET (112 MCG TOTAL) BY MOUTH  DAILY BEFORE BREAKFAST. 45 tablet 0   No current facility-administered medications on file prior to visit.    No Known Allergies Family History  Problem Relation Age of Onset  . Hypertension Father   . Diabetes Father   . Hypothyroidism Father   . Cancer Father        prostate  . Breast cancer Neg Hx    PE: BP (!) 170/92   Pulse 78   Ht 5' 5.5" (1.664 m)   Wt 240 lb 12.8 oz (109.2 kg)   BMI 39.46 kg/m  Body mass index is 39.46 kg/m. Wt Readings from Last 3 Encounters:  01/11/18 240 lb 12.8 oz (109.2 kg)  03/24/17 253 lb (114.8 kg)  01/11/17 254 lb (115.2 kg)   Constitutional: overweight, in NAD Eyes: PERRLA, EOMI, no exophthalmos ENT: moist mucous membranes, no neck masses thyroidectomy scar healed with little keloid, no cervical lymphadenopathy Cardiovascular: RRR, No MRG Respiratory: CTA B Gastrointestinal: abdomen soft, NT, ND, BS+ Musculoskeletal: no deformities, strength intact in all 4 Skin: moist, warm, no rashes Neurological: no tremor with outstretched hands, DTR normal in all 4  ASSESSMENT: 1. Postsurgical Hypothyroidism - on Brand name Synthroid  2. Thyroid Cancer (stage 1 or 2) in remission  3. High BP  PLAN:  1. Patient with long-standing, iatrogenic hypothyroidism, after surgery for thyroid cancer.  She is on levothyroxine therapy - latest thyroid labs reviewed with pt >> normal  - she continues on LT4 112 mcg daily (decreased at last visit from 125 mcg daily as TSH was suppressed). - pt feels good on this dose. - we discussed about taking the thyroid hormone every day, with water, >30 minutes before breakfast, separated by >4 hours from acid reflux medications, calcium, iron, multivitamins. Pt. is taking it correctly. - will check thyroid tests today: TSH and fT4 - If labs are abnormal, she will need to return for repeat TFTs in 1.5 months  2. PTC -Reviewed history: Whole body scans  were negative up to 2010.  She has had undetectable  thyroglobulin level since then.  She had one instance of elevated ATA antibodies, but 3 sets of ATA antibodies have been negative since. -we will check thyroglobulin and ATA ([facility]) today - Plan to check a neck ultrasound in 10 years after the last whole body scan: at next visit  3. High BP - very high BP at the beginning of the appt >> on repeat: 143/90 - advised her to start checking blood pressure at home and needs tx if the BP remains high >> advised her to alert PCP  Needs refills.  Component     Latest Ref Rng & Units 01/11/2018  TSH     0.35 - 4.50 uIU/mL  0.30 (L)  T4,Free(Direct)     0.60 - 1.60 ng/dL 0.96  Thyroglobulin Antibody     0.0 - 0.9 IU/mL <1.0  Thyroglobulin by IMA     1.5 - 38.5 ng/mL <0.1 (L)  Great thyroglobulin and thyroglobulin antibodies.  However, TSH is slightly low.  I will have her back for recheck in 1.5 months and will need to decrease the dose further if it is still low.  Philemon Kingdom, MD PhD [facility] Endocrinology

## 2018-01-11 NOTE — Patient Instructions (Signed)
Please continue Levothyroxine 112 mcg daily.  Take the thyroid hormone every day, with water, at least 30 minutes before breakfast, separated by at least 4 hours from: - acid reflux medications - calcium - iron - multivitamins  Please come back for a follow-up appointment in 1 year.

## 2018-01-12 LAB — T4, FREE: Free T4: 0.96 ng/dL (ref 0.60–1.60)

## 2018-01-12 LAB — TSH: TSH: 0.3 u[IU]/mL — ABNORMAL LOW (ref 0.35–4.50)

## 2018-01-13 LAB — THYROGLOBULIN BY IMA

## 2018-01-13 LAB — TGAB+THYROGLOBULIN IMA OR LCMS: Thyroglobulin Antibody: <1 IU/mL (ref 0.0–0.9)

## 2018-01-13 MED ORDER — SYNTHROID 112 MCG PO TABS
112.0000 ug | ORAL_TABLET | Freq: Every day | ORAL | 8 refills | Status: DC
Start: 2018-01-13 — End: 2018-04-01

## 2018-01-29 ENCOUNTER — Other Ambulatory Visit: Payer: Self-pay | Admitting: Internal Medicine

## 2018-01-29 DIAGNOSIS — E032 Hypothyroidism due to medicaments and other exogenous substances: Secondary | ICD-10-CM

## 2018-03-14 ENCOUNTER — Other Ambulatory Visit (INDEPENDENT_AMBULATORY_CARE_PROVIDER_SITE_OTHER): Payer: BLUE CROSS/BLUE SHIELD

## 2018-03-14 DIAGNOSIS — E032 Hypothyroidism due to medicaments and other exogenous substances: Secondary | ICD-10-CM

## 2018-03-14 LAB — TSH: TSH: 0.65 u[IU]/mL (ref 0.35–4.50)

## 2018-03-14 LAB — T4, FREE: FREE T4: 0.96 ng/dL (ref 0.60–1.60)

## 2018-03-15 ENCOUNTER — Other Ambulatory Visit: Payer: Self-pay | Admitting: Internal Medicine

## 2018-03-15 DIAGNOSIS — E032 Hypothyroidism due to medicaments and other exogenous substances: Secondary | ICD-10-CM

## 2018-03-17 ENCOUNTER — Other Ambulatory Visit: Payer: Self-pay | Admitting: Internal Medicine

## 2018-03-17 DIAGNOSIS — E032 Hypothyroidism due to medicaments and other exogenous substances: Secondary | ICD-10-CM

## 2018-03-17 MED ORDER — LEVOTHYROXINE SODIUM 112 MCG PO TABS
112.0000 ug | ORAL_TABLET | Freq: Every day | ORAL | 0 refills | Status: DC
Start: 2018-03-17 — End: 2018-04-01

## 2018-03-30 ENCOUNTER — Encounter: Payer: Self-pay | Admitting: Internal Medicine

## 2018-04-01 ENCOUNTER — Other Ambulatory Visit: Payer: Self-pay | Admitting: *Deleted

## 2018-04-01 DIAGNOSIS — E032 Hypothyroidism due to medicaments and other exogenous substances: Secondary | ICD-10-CM

## 2018-04-01 MED ORDER — SYNTHROID 112 MCG PO TABS: 112.0000 ug | ORAL_TABLET | Freq: Every day | ORAL | 2 refills | Status: DC

## 2018-06-21 ENCOUNTER — Telehealth: Payer: Self-pay | Admitting: Emergency Medicine

## 2018-06-21 NOTE — Telephone Encounter (Signed)
Copied from [place] [id number]. Topic: General - Other >> Jun 21, 2018  8:42 AM Judyann Munson wrote: Reason for CRM: Patient is calling g to speak with a nurse in regards to her blood pressure. Please advise

## 2018-06-21 NOTE — Telephone Encounter (Signed)
[facility] 190/107, last month high at the dentist. Denies blurred vision, headache, dizziness. C/o of fatigue.    Appt scheduled for tomorrow.

## 2018-06-21 NOTE — Progress Notes (Signed)
Subjective:    Patient ID: Shelly Baldwin, female    DOB: July 27, 1966, 52 y.o.   MRN: [id number]  HPI The patient is here for an acute visit for elevated BP.   [facility] 193/107 which is the same as when she went to the dentist.  Earlier this year it was also very high - 188/?Marland Kitchen   She felt fine yesterday.  She has not had any regular headaches, lightheadedness, dizziness, chest pain, palpitations or shortness of breath.  He does have some mild leg swelling intermittently.   She is exercising regularly.  She is cooking with salt.  She does realize she needs to start using a non-salt substitute.  She has gained weight since restarting eating meat-she has lost weight when she was eating vegan.  Her father has hypertension.  Medications and allergies reviewed with patient and updated if appropriate.  Patient Active Problem List   Diagnosis Date Noted  . Menorrhagia with irregular cycle 03/24/2017  . Borderline hypertension 09/17/2015  . Obesity, Class I, BMI 30.0-34.9 (see actual BMI) 12/15/2013  . Hypothyroidism, iatrogenic 08/04/2012  . Thyroid cancer [facility])     Current Outpatient Medications on File Prior to Visit  Medication Sig Dispense Refill  . fluticasone (FLONASE) 50 MCG/ACT nasal spray Place into both nostrils daily.    . Loratadine (CLARITIN PO) Take by mouth daily.    Marland Kitchen SYNTHROID 112 MCG tablet Take 1 tablet (112 mcg total) by mouth daily before breakfast. 90 tablet 2   No current facility-administered medications on file prior to visit.     Past Medical History:  Diagnosis Date  . Cancer (HCC)    undifferentiated thryoid  . Thyroid disease     Past Surgical History:  Procedure Laterality Date  . G0P0    . THYROIDECTOMY     2005    Social History   Socioeconomic History  . Marital status: Single    Spouse name: Not on file  . Number of children: 0  . Years of education: 52  . Highest education level: Not on file  Occupational  History  . Occupation: Hotel manager support  Social Needs  . Financial resource strain: Not on file  . Food insecurity:    Worry: Not on file    Inability: Not on file  . Transportation needs:    Medical: Not on file    Non-medical: Not on file  Tobacco Use  . Smoking status: Former Smoker    Last attempt to quit: 08/13/1986    Years since quitting: 31.8  . Smokeless tobacco: Never Used  Substance and Sexual Activity  . Alcohol use: No  . Drug use: No  . Sexual activity: Not on file  Lifestyle  . Physical activity:    Days per week: Not on file    Minutes per session: Not on file  . Stress: Not on file  Relationships  . Social connections:    Talks on phone: Not on file    Gets together: Not on file    Attends religious service: Not on file    Active member of club or organization: Not on file    Attends meetings of clubs or organizations: Not on file    Relationship status: Not on file  Other Topics Concern  . Not on file  Social History Narrative   HSG, [employer] in [place], matriculating [employer] (Sept '12). Single. Work - Loss adjuster, chartered at [employer]. \Do you exercise at  least 3 times a week- twice a week with walking. Is there a smoke alarm in your house- yes. Are there guns and firearms in your household- no . Have you experienced physical abuse-no         Usual # of hours of sleep per night 6-7 hours per night   Number of people living at your residence- Pt lives alone                   Family History  Problem Relation Age of Onset  . Hypertension Father   . Diabetes Father   . Hypothyroidism Father   . Cancer Father        prostate  . Breast cancer Neg Hx     Review of Systems  Constitutional: Negative for chills and fever.  Respiratory: Positive for cough (mild a few nights ago). Negative for shortness of breath and wheezing.   Cardiovascular: Positive for leg swelling. Negative for chest pain and palpitations.  Neurological: Positive for  headaches (occasional). Negative for dizziness and light-headedness.       Objective:   Vitals:   06/22/18 1048  BP: (!) 158/88  Pulse: 67  Resp: 16  Temp: 98.4 F (36.9 C)  SpO2: 99%   BP Readings from Last 3 Encounters:  06/22/18 (!) 158/88  01/11/18 (!) 148/90  03/24/17 132/86   Wt Readings from Last 3 Encounters:  06/22/18 242 lb (109.8 kg)  01/11/18 240 lb 12.8 oz (109.2 kg)  03/24/17 253 lb (114.8 kg)   Body mass index is 39.66 kg/m.   Physical Exam    Constitutional: Appears well-developed and well-nourished. No distress.  HENT:  Head: Normocephalic and atraumatic.  Neck: Neck supple. No tracheal deviation present. No thyromegaly present.  No cervical lymphadenopathy Cardiovascular: Normal rate, regular rhythm and normal heart sounds.   No murmur heard. No carotid bruit .  No edema Pulmonary/Chest: Effort normal and breath sounds normal. No respiratory distress. No has no wheezes. No rales.  Skin: Skin is warm and dry. Not diaphoretic.  Psychiatric: Normal mood and affect. Behavior is normal.       Assessment & Plan:    See Problem List for Assessment and Plan of chronic medical problems.

## 2018-06-22 ENCOUNTER — Ambulatory Visit: Payer: BLUE CROSS/BLUE SHIELD | Admitting: Internal Medicine

## 2018-06-22 ENCOUNTER — Encounter: Payer: Self-pay | Admitting: Internal Medicine

## 2018-06-22 VITALS — BP 158/88 | HR 67 | Temp 98.4°F | Resp 16 | Wt 242.0 lb

## 2018-06-22 DIAGNOSIS — I10 Essential (primary) hypertension: Secondary | ICD-10-CM | POA: Diagnosis not present

## 2018-06-22 MED ORDER — AMLODIPINE BESYLATE 5 MG PO TABS: 5.0000 mg | ORAL_TABLET | Freq: Every day | ORAL | 5 refills | Status: DC

## 2018-06-22 MED ORDER — HYDROCHLOROTHIAZIDE 25 MG PO TABS: 25.0000 mg | ORAL_TABLET | Freq: Every day | ORAL | 5 refills | Status: DC

## 2018-06-22 NOTE — Assessment & Plan Note (Addendum)
New diagnosis Father has hypertension Discussed importance of low-sodium diet, regular exercise and weight loss Advised her to start monitoring her blood pressure regularly and discussed goals Discussed potential consequences of uncontrolled hypertension. Reviewed treatment options and possible side effects Start hydrochlorothiazide 25 mg daily and after a few days start amlodipine 5 mg daily Monitor blood pressure and call with any concerns of side effects or blood pressure numbers Follow-up in a few weeks-we will obtain blood work at that time

## 2018-06-22 NOTE — Patient Instructions (Addendum)
BP should be ideally less than 130/80 but has to be consistently less than 140/90.   Medications reviewed and updated.  Changes include starting hydrochlorothiazide daily in the morning.  After a few days add the amlodipine daily ( morning or night).  Your prescription(s) have been submitted to your pharmacy. Please take as directed and contact our office if you believe you are having problem(s) with the medication(s).  Monitor your BP if you can.   Please followup in 3 weeks     Hypertension Hypertension, commonly called high blood pressure, is when the force of blood pumping through the arteries is too strong. The arteries are the blood vessels that carry blood from the heart throughout the body. Hypertension forces the heart to work harder to pump blood and may cause arteries to become narrow or stiff. Having untreated or uncontrolled hypertension can cause heart attacks, strokes, kidney disease, and other problems. A blood pressure reading consists of a higher number over a lower number. Ideally, your blood pressure should be below 120/80. The first ("top") number is called the systolic pressure. It is a measure of the pressure in your arteries as your heart beats. The second ("bottom") number is called the diastolic pressure. It is a measure of the pressure in your arteries as the heart relaxes. What are the causes? The cause of this condition is not known. What increases the risk? Some risk factors for high blood pressure are under your control. Others are not. Factors you can change  Smoking.  Having type 2 diabetes mellitus, high cholesterol, or both.  Not getting enough exercise or physical activity.  Being overweight.  Having too much fat, sugar, calories, or salt (sodium) in your diet.  Drinking too much alcohol. Factors that are difficult or impossible to change  Having chronic kidney disease.  Having a family history of high blood pressure.  Age. Risk increases  with age.  Race. You may be at higher risk if you are African-American.  Gender. Men are at higher risk than women before age 73. After age 52, women are at higher risk than men.  Having obstructive sleep apnea.  Stress. What are the signs or symptoms? Extremely high blood pressure (hypertensive crisis) may cause:  Headache.  Anxiety.  Shortness of breath.  Nosebleed.  Nausea and vomiting.  Severe chest pain.  Jerky movements you cannot control (seizures).  How is this diagnosed? This condition is diagnosed by measuring your blood pressure while you are seated, with your arm resting on a surface. The cuff of the blood pressure monitor will be placed directly against the skin of your upper arm at the level of your heart. It should be measured at least twice using the same arm. Certain conditions can cause a difference in blood pressure between your right and left arms. Certain factors can cause blood pressure readings to be lower or higher than normal (elevated) for a short period of time:  When your blood pressure is higher when you are in a health care provider's office than when you are at home, this is called white coat hypertension. Most people with this condition do not need medicines.  When your blood pressure is higher at home than when you are in a health care provider's office, this is called masked hypertension. Most people with this condition may need medicines to control blood pressure.  If you have a high blood pressure reading during one visit or you have normal blood pressure with other risk factors:  You may be asked to return on a different day to have your blood pressure checked again.  You may be asked to monitor your blood pressure at home for 1 week or longer.  If you are diagnosed with hypertension, you may have other blood or imaging tests to help your health care provider understand your overall risk for other conditions. How is this treated? This  condition is treated by making healthy lifestyle changes, such as eating healthy foods, exercising more, and reducing your alcohol intake. Your health care provider may prescribe medicine if lifestyle changes are not enough to get your blood pressure under control, and if:  Your systolic blood pressure is above 130.  Your diastolic blood pressure is above 80.  Your personal target blood pressure may vary depending on your medical conditions, your age, and other factors. Follow these instructions at home: Eating and drinking  Eat a diet that is high in fiber and potassium, and low in sodium, added sugar, and fat. An example eating plan is called the DASH (Dietary Approaches to Stop Hypertension) diet. To eat this way: ? Eat plenty of fresh fruits and vegetables. Try to fill half of your plate at each meal with fruits and vegetables. ? Eat whole grains, such as whole wheat pasta, brown rice, or whole grain bread. Fill about one quarter of your plate with whole grains. ? Eat or drink low-fat dairy products, such as skim milk or low-fat yogurt. ? Avoid fatty cuts of meat, processed or cured meats, and poultry with skin. Fill about one quarter of your plate with lean proteins, such as fish, chicken without skin, beans, eggs, and tofu. ? Avoid premade and processed foods. These tend to be higher in sodium, added sugar, and fat.  Reduce your daily sodium intake. Most people with hypertension should eat less than 1,500 mg of sodium a day.  Limit alcohol intake to no more than 1 drink a day for nonpregnant women and 2 drinks a day for men. One drink equals 12 oz of beer, 5 oz of wine, or 1 oz of hard liquor. Lifestyle  Work with your health care provider to maintain a healthy body weight or to lose weight. Ask what an ideal weight is for you.  Get at least 30 minutes of exercise that causes your heart to beat faster (aerobic exercise) most days of the week. Activities may include walking, swimming,  or biking.  Include exercise to strengthen your muscles (resistance exercise), such as pilates or lifting weights, as part of your weekly exercise routine. Try to do these types of exercises for 30 minutes at least 3 days a week.  Do not use any products that contain nicotine or tobacco, such as cigarettes and e-cigarettes. If you need help quitting, ask your health care provider.  Monitor your blood pressure at home as told by your health care provider.  Keep all follow-up visits as told by your health care provider. This is important. Medicines  Take over-the-counter and prescription medicines only as told by your health care provider. Follow directions carefully. Blood pressure medicines must be taken as prescribed.  Do not skip doses of blood pressure medicine. Doing this puts you at risk for problems and can make the medicine less effective.  Ask your health care provider about side effects or reactions to medicines that you should watch for. Contact a health care provider if:  You think you are having a reaction to a medicine you are taking.  You have  headaches that keep coming back (recurring).  You feel dizzy.  You have swelling in your ankles.  You have trouble with your vision. Get help right away if:  You develop a severe headache or confusion.  You have unusual weakness or numbness.  You feel faint.  You have severe pain in your chest or abdomen.  You vomit repeatedly.  You have trouble breathing. Summary  Hypertension is when the force of blood pumping through your arteries is too strong. If this condition is not controlled, it may put you at risk for serious complications.  Your personal target blood pressure may vary depending on your medical conditions, your age, and other factors. For most people, a normal blood pressure is less than 120/80.  Hypertension is treated with lifestyle changes, medicines, or a combination of both. Lifestyle changes include  weight loss, eating a healthy, low-sodium diet, exercising more, and limiting alcohol. This information is not intended to replace advice given to you by your health care provider. Make sure you discuss any questions you have with your health care provider. Document Released: 11/02/2005 Document Revised: 09/30/2016 Document Reviewed: 09/30/2016 Elsevier Interactive Patient Education  [employer].

## 2018-07-12 NOTE — Progress Notes (Signed)
Subjective:    Patient ID: Shelly Baldwin, female    DOB: 07-Sep-1966, 52 y.o.   MRN: [id number]  HPI The patient is here for follow up of her BP - she was here 3 weeks ago.  Hypertension: She is taking her medication but not taking both of them daily.  She has taken the hctz for a couple of day and then the amlodipine for a couple of days.  . She is compliant with a low sodium diet.  She denies chest pain, palpitations, edema, shortness of breath and regular headaches. She is exercising regularly - 3-4 days/week.  She does not monitor her blood pressure at home.     Medications and allergies reviewed with patient and updated if appropriate.  Patient Active Problem List   Diagnosis Date Noted  . Menorrhagia with irregular cycle 03/24/2017  . Hypertension 09/17/2015  . Obesity, Class I, BMI 30.0-34.9 (see actual BMI) 12/15/2013  . Hypothyroidism, iatrogenic 08/04/2012  . Thyroid cancer [facility])     Current Outpatient Medications on File Prior to Visit  Medication Sig Dispense Refill  . amLODipine (NORVASC) 5 MG tablet Take 1 tablet (5 mg total) by mouth daily. 30 tablet 5  . fluticasone (FLONASE) 50 MCG/ACT nasal spray Place into both nostrils daily.    . hydrochlorothiazide (HYDRODIURIL) 25 MG tablet Take 1 tablet (25 mg total) by mouth daily. 30 tablet 5  . Loratadine (CLARITIN PO) Take by mouth daily.    Marland Kitchen SYNTHROID 112 MCG tablet Take 1 tablet (112 mcg total) by mouth daily before breakfast. 90 tablet 2   No current facility-administered medications on file prior to visit.     Past Medical History:  Diagnosis Date  . Cancer (HCC)    undifferentiated thryoid  . Thyroid disease     Past Surgical History:  Procedure Laterality Date  . G0P0    . THYROIDECTOMY     2005    Social History   Socioeconomic History  . Marital status: Single    Spouse name: Not on file  . Number of children: 0  . Years of education: 28  . Highest education level: Not on file  Occupational  History  . Occupation: Hotel manager support  Social Needs  . Financial resource strain: Not on file  . Food insecurity:    Worry: Not on file    Inability: Not on file  . Transportation needs:    Medical: Not on file    Non-medical: Not on file  Tobacco Use  . Smoking status: Former Smoker    Last attempt to quit: 08/13/1986    Years since quitting: 31.9  . Smokeless tobacco: Never Used  Substance and Sexual Activity  . Alcohol use: No  . Drug use: No  . Sexual activity: Not on file  Lifestyle  . Physical activity:    Days per week: Not on file    Minutes per session: Not on file  . Stress: Not on file  Relationships  . Social connections:    Talks on phone: Not on file    Gets together: Not on file    Attends religious service: Not on file    Active member of club or organization: Not on file    Attends meetings of clubs or organizations: Not on file    Relationship status: Not on file  Other Topics Concern  . Not on file  Social History Narrative   HSG, [employer] in [place], matriculating [employer] (Sept '12). Single. 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 \Do you exercise at least 3 times a week- twice a week with walking. Is there a smoke alarm in your house- yes. Are there guns and firearms in your household- no . Have you experienced physical abuse-no         Usual # of hours of sleep per night 6-7 hours per night   Number of people living at your residence- Pt lives alone                   Family History  Problem Relation Age of Onset  . Hypertension Father   . Diabetes Father   . Hypothyroidism Father   . Cancer Father        prostate  . Breast cancer Neg Hx     Review of Systems  Constitutional: Negative for fever.  Respiratory: Negative for shortness of breath.   Cardiovascular: Negative for chest pain, palpitations and leg swelling.  Neurological: Negative for light-headedness and headaches.       Objective:   Vitals:   07/13/18 0930  07/13/18 1249  BP: (!) 164/86 (!) 150/90  Pulse: (!) 56   Resp: 16   Temp: 98.5 F (36.9 C)   SpO2: 98%    BP Readings from Last 3 Encounters:  07/13/18 (!) 150/90  06/22/18 (!) 158/88  01/11/18 (!) 148/90   Wt Readings from Last 3 Encounters:  07/13/18 240 lb (108.9 kg)  06/22/18 242 lb (109.8 kg)  01/11/18 240 lb 12.8 oz (109.2 kg)   Body mass index is 39.33 kg/m.   Physical Exam    Constitutional: Appears well-developed and well-nourished. No distress.  HENT:  Head: Normocephalic and atraumatic.  Neck: Neck supple. No tracheal deviation present. No thyromegaly present.  No cervical lymphadenopathy Cardiovascular: Normal rate, regular rhythm and normal heart sounds.   No murmur heard. No carotid bruit .  No edema Pulmonary/Chest: Effort normal and breath sounds normal. No respiratory distress. No has no wheezes. No rales.  Skin: Skin is warm and dry. Not diaphoretic.  Psychiatric: Normal mood and affect. Behavior is normal.      Assessment & Plan:    See Problem List for Assessment and Plan of chronic medical problems.

## 2018-07-12 NOTE — Patient Instructions (Addendum)
Monitor your BP - it should ideally be less than 140/90.  Update me with your readings so we can adjust your medication if needed.  Test(s) ordered today. Your results will be released to [place] (or called to you) after review, usually within 72hours after test completion. If any changes need to be made, you will be notified at that same time.   No immunizations administered today.   Medications reviewed and updated.  No changes recommended at this time.  Take both medicaiton daily in the mroning.     Please followup in 2 months

## 2018-07-13 ENCOUNTER — Other Ambulatory Visit (INDEPENDENT_AMBULATORY_CARE_PROVIDER_SITE_OTHER): Payer: BLUE CROSS/BLUE SHIELD

## 2018-07-13 ENCOUNTER — Encounter: Payer: Self-pay | Admitting: Internal Medicine

## 2018-07-13 ENCOUNTER — Ambulatory Visit: Payer: BLUE CROSS/BLUE SHIELD | Admitting: Internal Medicine

## 2018-07-13 VITALS — BP 150/90 | HR 56 | Temp 98.5°F | Resp 16 | Ht 65.5 in | Wt 240.0 lb

## 2018-07-13 DIAGNOSIS — I10 Essential (primary) hypertension: Secondary | ICD-10-CM

## 2018-07-13 LAB — CBC WITH DIFFERENTIAL/PLATELET
BASOS ABS: 0 10*3/uL (ref 0.0–0.1)
Basophils Relative: 1.1 % (ref 0.0–3.0)
EOS PCT: 2.6 % (ref 0.0–5.0)
Eosinophils Absolute: 0.1 10*3/uL (ref 0.0–0.7)
HCT: 33 % — ABNORMAL LOW (ref 36.0–46.0)
HEMOGLOBIN: 10.6 g/dL — AB (ref 12.0–15.0)
LYMPHS PCT: 27.6 % (ref 12.0–46.0)
Lymphs Abs: 1.1 10*3/uL (ref 0.7–4.0)
MCHC: 32.1 g/dL (ref 30.0–36.0)
MCV: 79 fl (ref 78.0–100.0)
MONO ABS: 0.4 10*3/uL (ref 0.1–1.0)
Monocytes Relative: 9.6 % (ref 3.0–12.0)
NEUTROS PCT: 59.1 % (ref 43.0–77.0)
Neutro Abs: 2.4 10*3/uL (ref 1.4–7.7)
Platelets: 324 10*3/uL (ref 150.0–400.0)
RBC: 4.18 Mil/uL (ref 3.87–5.11)
RDW: 18.4 % — ABNORMAL HIGH (ref 11.5–15.5)
WBC: 4 10*3/uL (ref 4.0–10.5)

## 2018-07-13 LAB — COMPREHENSIVE METABOLIC PANEL
ALBUMIN: 4.7 g/dL (ref 3.5–5.2)
ALK PHOS: 73 U/L (ref 39–117)
ALT: 15 U/L (ref 0–35)
AST: 18 U/L (ref 0–37)
BILIRUBIN TOTAL: 0.5 mg/dL (ref 0.2–1.2)
BUN: 13 mg/dL (ref 6–23)
CO2: 30 mEq/L (ref 19–32)
CREATININE: 1.18 mg/dL (ref 0.40–1.20)
Calcium: 10.2 mg/dL (ref 8.4–10.5)
Chloride: 99 mEq/L (ref 96–112)
GFR: 50.95 mL/min — ABNORMAL LOW (ref >60.00)
GLUCOSE: 97 mg/dL (ref 70–99)
Potassium: 3.2 mEq/L — ABNORMAL LOW (ref 3.5–5.1)
SODIUM: 138 meq/L (ref 135–145)
TOTAL PROTEIN: 7.7 g/dL (ref 6.0–8.3)

## 2018-07-13 NOTE — Assessment & Plan Note (Signed)
Check CBC, CMP Continue low-sodium diet regular exercise Weight loss efforts She will purchase a blood pressure cuff and start monitoring her blood pressure at home Advised that she needs to take both medication daily in the morning and she will start doing that Will not make any adjustments to her medication at this time given that she is only been taking one medication at a time We will adjust over the phone if blood pressure is elevated Follow-up in 2 months

## 2018-07-20 ENCOUNTER — Encounter: Payer: Self-pay | Admitting: Internal Medicine

## 2018-07-27 ENCOUNTER — Encounter: Payer: Self-pay | Admitting: Internal Medicine

## 2018-08-25 ENCOUNTER — Encounter: Payer: Self-pay | Admitting: Internal Medicine

## 2018-08-30 NOTE — Progress Notes (Signed)
Subjective:    Patient ID: Shelly Baldwin, female    DOB: 25-Apr-1966, 52 y.o.   MRN: [id number]  HPI The patient is here for follow up.  Hypertension: She is taking her medication daily. She is compliant with a low sodium diet.  She denies chest pain, palpitations, edema, shortness of breath and regular headaches. She is exercising regularly - 3-4 times a week.  She does monitor her blood pressure at home - 129/85 last night.  Anemia: Her recent blood work showed some anemia.  She states she is still having periods that are irregular.  At times they are heavy.  Her muscles in lower back get tight at times - often from tension and sitting a lot at work.  Massage helps.  Work states that they will pay for if I sent a paper saying it is medically necessary.  Massage therapy is effective and does help prevent her from taking it medications, so I would recommend she continue with this.   Medications and allergies reviewed with patient and updated if appropriate.  Patient Active Problem List   Diagnosis Date Noted  . Anemia 08/31/2018  . Menorrhagia with irregular cycle 03/24/2017  . Hypertension 09/17/2015  . Obesity, Class I, BMI 30.0-34.9 (see actual BMI) 12/15/2013  . Hypothyroidism, iatrogenic 08/04/2012  . Thyroid cancer [facility])     Current Outpatient Medications on File Prior to Visit  Medication Sig Dispense Refill  . amLODipine (NORVASC) 5 MG tablet Take 1 tablet (5 mg total) by mouth daily. 30 tablet 5  . fluticasone (FLONASE) 50 MCG/ACT nasal spray Place into both nostrils daily.    . hydrochlorothiazide (HYDRODIURIL) 25 MG tablet Take 1 tablet (25 mg total) by mouth daily. 30 tablet 5  . Loratadine (CLARITIN PO) Take by mouth daily.    Marland Kitchen SYNTHROID 112 MCG tablet Take 1 tablet (112 mcg total) by mouth daily before breakfast. 90 tablet 2   No current facility-administered medications on file prior to visit.     Past Medical History:  Diagnosis Date  . Cancer (HCC)    undifferentiated thryoid  . Thyroid disease     Past Surgical History:  Procedure Laterality Date  . G0P0    . THYROIDECTOMY     2005    Social History   Socioeconomic History  . Marital status: Single    Spouse name: Not on file  . Number of children: 0  . Years of education: 49  . Highest education level: Not on file  Occupational History  . Occupation: Hotel manager support  Social Needs  . Financial resource strain: Not on file  . Food insecurity:    Worry: Not on file    Inability: Not on file  . Transportation needs:    Medical: Not on file    Non-medical: Not on file  Tobacco Use  . Smoking status: Former Smoker    Last attempt to quit: 08/13/1986    Years since quitting: 32.0  . Smokeless tobacco: Never Used  Substance and Sexual Activity  . Alcohol use: No  . Drug use: No  . Sexual activity: Not on file  Lifestyle  . Physical activity:    Days per week: Not on file    Minutes per session: Not on file  . Stress: Not on file  Relationships  . Social connections:    Talks on phone: Not on file    Gets together: Not on file    Attends religious service: Not on file  Active member of club or organization: Not on file    Attends meetings of clubs or organizations: Not on file    Relationship status: Not on file  Other Topics Concern  . Not on file  Social History Narrative   HSG, [employer] in [place], matriculating [employer] (Sept '12). Single. Work - Loss adjuster, chartered at [employer]. \Do you exercise at least 3 times a week- twice a week with walking. Is there a smoke alarm in your house- yes. Are there guns and firearms in your household- no . Have you experienced physical abuse-no         Usual # of hours of sleep per night 6-7 hours per night   Number of people living at your residence- Pt lives alone                   Family History  Problem Relation Age of Onset  . Hypertension Father   . Diabetes Father   . Hypothyroidism Father   . Cancer  Father        prostate  . Breast cancer Neg Hx     Review of Systems  Constitutional: Negative for chills and fever.  Respiratory: Negative for cough, shortness of breath and wheezing.   Cardiovascular: Negative for chest pain, palpitations and leg swelling.  Neurological: Negative for light-headedness and headaches.       Objective:   Vitals:   08/31/18 0944 08/31/18 1034  BP: (!) 154/82 140/82  Pulse: (!) 58   Resp: 16   Temp: 97.8 F (36.6 C)   SpO2: 97%    BP Readings from Last 3 Encounters:  08/31/18 140/82  07/13/18 (!) 150/90  06/22/18 (!) 158/88   Wt Readings from Last 3 Encounters:  08/31/18 240 lb (108.9 kg)  07/13/18 240 lb (108.9 kg)  06/22/18 242 lb (109.8 kg)   Body mass index is 39.33 kg/m.   Physical Exam    Constitutional: Appears well-developed and well-nourished. No distress.  HENT:  Head: Normocephalic and atraumatic.  Neck: Neck supple. No tracheal deviation present. No thyromegaly present.  No cervical lymphadenopathy Cardiovascular: Normal rate, regular rhythm and normal heart sounds.   1/6 systolic murmur heard. No carotid bruit .  No edema Pulmonary/Chest: Effort normal and breath sounds normal. No respiratory distress. No has no wheezes. No rales.  Skin: Skin is warm and dry. Not diaphoretic.  Psychiatric: Normal mood and affect. Behavior is normal.      Assessment & Plan:    See Problem List for Assessment and Plan of chronic medical problems.

## 2018-08-31 ENCOUNTER — Ambulatory Visit: Payer: BLUE CROSS/BLUE SHIELD | Admitting: Internal Medicine

## 2018-08-31 ENCOUNTER — Other Ambulatory Visit (INDEPENDENT_AMBULATORY_CARE_PROVIDER_SITE_OTHER): Payer: BLUE CROSS/BLUE SHIELD

## 2018-08-31 ENCOUNTER — Encounter: Payer: Self-pay | Admitting: Internal Medicine

## 2018-08-31 VITALS — BP 140/82 | HR 58 | Temp 97.8°F | Resp 16 | Ht 65.5 in | Wt 240.0 lb

## 2018-08-31 DIAGNOSIS — I10 Essential (primary) hypertension: Secondary | ICD-10-CM | POA: Diagnosis not present

## 2018-08-31 DIAGNOSIS — Z23 Encounter for immunization: Secondary | ICD-10-CM

## 2018-08-31 DIAGNOSIS — D649 Anemia, unspecified: Secondary | ICD-10-CM | POA: Diagnosis not present

## 2018-08-31 LAB — BASIC METABOLIC PANEL
BUN: 18 mg/dL (ref 6–23)
CO2: 30 meq/L (ref 19–32)
CREATININE: 1.07 mg/dL (ref 0.40–1.20)
Calcium: 9.9 mg/dL (ref 8.4–10.5)
Chloride: 100 mEq/L (ref 96–112)
GFR: 57.01 mL/min — ABNORMAL LOW (ref >60.00)
GLUCOSE: 96 mg/dL (ref 70–99)
Potassium: 3.7 mEq/L (ref 3.5–5.1)
Sodium: 138 mEq/L (ref 135–145)

## 2018-08-31 LAB — CBC WITH DIFFERENTIAL/PLATELET
BASOS ABS: 0.1 10*3/uL (ref 0.0–0.1)
Basophils Relative: 1.4 % (ref 0.0–3.0)
EOS ABS: 0.2 10*3/uL (ref 0.0–0.7)
Eosinophils Relative: 3.8 % (ref 0.0–5.0)
HCT: 32.6 % — ABNORMAL LOW (ref 36.0–46.0)
Hemoglobin: 10.5 g/dL — ABNORMAL LOW (ref 12.0–15.0)
LYMPHS ABS: 1 10*3/uL (ref 0.7–4.0)
Lymphocytes Relative: 24.4 % (ref 12.0–46.0)
MCHC: 32.3 g/dL (ref 30.0–36.0)
MCV: 78.6 fl (ref 78.0–100.0)
Monocytes Absolute: 0.4 10*3/uL (ref 0.1–1.0)
Monocytes Relative: 9.4 % (ref 3.0–12.0)
NEUTROS PCT: 61 % (ref 43.0–77.0)
Neutro Abs: 2.5 10*3/uL (ref 1.4–7.7)
PLATELETS: 343 10*3/uL (ref 150.0–400.0)
RBC: 4.15 Mil/uL (ref 3.87–5.11)
RDW: 19.6 % — AB (ref 11.5–15.5)
WBC: 4.1 10*3/uL (ref 4.0–10.5)

## 2018-08-31 NOTE — Assessment & Plan Note (Signed)
Possibly related to heavy periods-currently they are irregular Will check CBC, iron panel

## 2018-08-31 NOTE — Patient Instructions (Addendum)
  Tests ordered today. Your results will be released to [place] (or called to you) after review, usually within 72hours after test completion. If any changes need to be made, you will be notified at that same time.  Flu immunization administered today.     Continue to monitor your BP at home - the goal is less than 140/90 consistently.   Medications reviewed and updated.  Changes include :   none   Please followup in 6 months

## 2018-08-31 NOTE — Assessment & Plan Note (Addendum)
Blood pressure elevated here today, but she states it is well controlled at home-repeat here today is better Possibly mild whitecoat hypertension She will continue to monitor at home-goal of less than 140/90 consistently Discussed consequences of uncontrolled hypertension and the importance of keeping it well controlled Continue current medications at current doses Continue regular exercise, low-sodium diet Encourage weight loss CMP

## 2018-09-01 LAB — IRON,TIBC AND FERRITIN PANEL
%SAT: 7 % — AB (ref 16–45)
Ferritin: 5 ng/mL — ABNORMAL LOW (ref 16–232)
Iron: 38 ug/dL — ABNORMAL LOW (ref 45–160)
TIBC: 520 mcg/dL (calc) — ABNORMAL HIGH (ref 250–450)

## 2018-09-02 ENCOUNTER — Encounter: Payer: Self-pay | Admitting: Internal Medicine

## 2018-10-24 ENCOUNTER — Other Ambulatory Visit: Payer: Self-pay | Admitting: Internal Medicine

## 2018-10-24 DIAGNOSIS — Z1231 Encounter for screening mammogram for malignant neoplasm of breast: Secondary | ICD-10-CM

## 2018-12-05 ENCOUNTER — Ambulatory Visit
Admission: RE | Admit: 2018-12-05 | Discharge: 2018-12-05 | Disposition: A | Discharge status: Home / Self Care | Payer: BLUE CROSS/BLUE SHIELD | Source: Ambulatory Visit | Attending: Internal Medicine | Admitting: Internal Medicine

## 2018-12-05 DIAGNOSIS — Z1231 Encounter for screening mammogram for malignant neoplasm of breast: Secondary | ICD-10-CM | POA: Diagnosis not present

## 2018-12-17 ENCOUNTER — Other Ambulatory Visit: Payer: Self-pay | Admitting: Internal Medicine

## 2019-01-02 ENCOUNTER — Other Ambulatory Visit: Payer: Self-pay | Admitting: Internal Medicine

## 2019-01-02 ENCOUNTER — Other Ambulatory Visit: Payer: Self-pay

## 2019-01-02 ENCOUNTER — Encounter: Payer: Self-pay | Admitting: Internal Medicine

## 2019-01-02 MED ORDER — AMLODIPINE BESYLATE 5 MG PO TABS: 5.0000 mg | ORAL_TABLET | Freq: Every day | ORAL | 1 refills | Status: DC

## 2019-01-11 ENCOUNTER — Other Ambulatory Visit: Payer: Self-pay

## 2019-01-11 ENCOUNTER — Ambulatory Visit: Payer: BLUE CROSS/BLUE SHIELD | Admitting: Internal Medicine

## 2019-01-11 ENCOUNTER — Encounter: Payer: Self-pay | Admitting: Internal Medicine

## 2019-01-11 VITALS — BP 140/90 | HR 88 | Ht 65.5 in | Wt 237.0 lb

## 2019-01-11 DIAGNOSIS — E669 Obesity, unspecified: Secondary | ICD-10-CM

## 2019-01-11 DIAGNOSIS — E032 Hypothyroidism due to medicaments and other exogenous substances: Secondary | ICD-10-CM

## 2019-01-11 DIAGNOSIS — C73 Malignant neoplasm of thyroid gland: Secondary | ICD-10-CM

## 2019-01-11 NOTE — Progress Notes (Signed)
Patient ID: Shelly Baldwin, female   DOB: 1966-01-11, 53 y.o.   MRN: [id number]   HPI  Shelly Baldwin is a 53 y.o.-year-old female, returning for f/u for thyroid cancer and postsurgical hypothyroidism. Last visit 1 year ago.  Reviewed and addended history: Pt. has been found to have a goiter with neck compression symptoms sxs, then had Bx >> inconclusive >> total thyroidectomy >> dx with thyroid cancer in 2004, had total thyroidectomy in 2005.  She remembers having had RAI treatment and a whole-body scan posttreatment. She had rTSH - stim WBS every year for 5 years >> no mets (up to 2010)  Pt is on Synthroid 112 Mcg daily, taken: - in am - fasting - at least 30 min from b'fast - no Ca, MVI, PPIs - Since last visit, she added Fe later in the day, and she was found to be anemic.  Ferritin was 5. - not on Biotin  Latest TSH was normal: Lab Results  Component Value Date   TSH 0.65 03/14/2018   TSH 0.30 (L) 01/11/2018   TSH 1.65 03/24/2017   TSH 0.17 (L) 01/11/2017   TSH 2.26 11/29/2015   TSH 0.42 11/26/2014   TSH 0.011 (L) 09/26/2014   TSH 0.01 (L) 03/22/2014   TSH 4.06 11/24/2013   TSH 0.21 (L) 08/04/2012   FREET4 0.96 03/14/2018   FREET4 0.96 01/11/2018   FREET4 1.04 01/11/2017   FREET4 0.86 11/29/2015   FREET4 0.95 11/26/2014   FREET4 1.59 09/26/2014   FREET4 1.50 03/22/2014   FREET4 0.70 11/24/2013   FREET4 0.79 08/14/2011    Her thyroglobulin levels have been undetectable: Lab Results  Component Value Date   THYROGLB <0.1 (L) 01/11/2018   THYROGLB <0.1 (L) 01/11/2017   THYROGLB <2.0 11/29/2015   THYROGLB <2.0 09/26/2014   THYROGLB <0.2 11/24/2013   THYROGLB <0.2 08/14/2011   She had one instance of elevated ATA antibodies: Lab Results  Component Value Date   THGAB <1.0 01/11/2018   THGAB <1.0 01/11/2017   THGAB <1.0 11/29/2015   THGAB <1.0 09/26/2014   THGAB 485.0 (H) 11/24/2013   THGAB <20.0 08/14/2011  Need to send Tg + ATA to [facility].  Pt denies: -  feeling nodules in neck - hoarseness - dysphagia - choking - SOB with lying down  Her blood pressure medications were adjusted since last visit.  Currently on amlodipine and HCTZ.  ROS: Constitutional: no weight gain/no weight loss, no fatigue, no subjective hyperthermia, no subjective hypothermia Eyes: no blurry vision, no xerophthalmia ENT: no sore throat, + see HPI Cardiovascular: no CP/no SOB/no palpitations/no leg swelling Respiratory: no cough/no SOB/no wheezing Gastrointestinal: no N/no V/no D/no C/no acid reflux Musculoskeletal: no muscle aches/no joint aches Skin: no rashes, no hair loss Neurological: no tremors/no numbness/no tingling/no dizziness  I reviewed pt's medications, allergies, PMH, social hx, family hx, and changes were documented in the history of present illness. Otherwise, unchanged from my initial visit note.  Past Medical History:  Diagnosis Date  . Cancer (HCC)    undifferentiated thryoid  . Thyroid disease    Past Surgical History:  Procedure Laterality Date  . G0P0    . THYROIDECTOMY     2005   History   Social History  . Marital Status: Single    Spouse Name: N/A    Number of Children: 0  . Years of Education: 14   Occupational History  . technical support    Social History Main Topics  . Smoking status: Former Smoker  Quit date: 08/13/1986  . Smokeless tobacco: Never Used  . Alcohol Use: No  . Drug Use: No   Social History Narrative   HSG, [employer] in [place], matriculating [employer] (Sept '12). Single. Work - Loss adjuster, chartered at [employer]. \Do you exercise at least 3 times a week- twice a week with walking. Is there a smoke alarm in your house- yes. Are there guns and firearms in your household- no . Have you experienced physical abuse-no         Usual # of hours of sleep per night 6-7 hours per night   Number of people living at your residence- Pt lives alone   Current Outpatient Medications on File Prior to Visit   Medication Sig Dispense Refill  . amLODipine (NORVASC) 5 MG tablet Take 1 tablet (5 mg total) by mouth daily. 90 tablet 1  . fluticasone (FLONASE) 50 MCG/ACT nasal spray Place into both nostrils daily.    . hydrochlorothiazide (HYDRODIURIL) 25 MG tablet TAKE 1 TABLET BY MOUTH EVERY DAY 90 tablet 1  . Loratadine (CLARITIN PO) Take by mouth daily.    Marland Kitchen SYNTHROID 112 MCG tablet Take 1 tablet (112 mcg total) by mouth daily before breakfast. 90 tablet 2   No current facility-administered medications on file prior to visit.    No Known Allergies Family History  Problem Relation Age of Onset  . Hypertension Father   . Diabetes Father   . Hypothyroidism Father   . Cancer Father        prostate  . Breast cancer Neg Hx    PE: BP 140/90   Pulse 88   Ht 5' 5.5" (1.664 m)   Wt 237 lb (107.5 kg)   SpO2 97%   BMI 38.84 kg/m  Body mass index is 38.84 kg/m. Wt Readings from Last 3 Encounters:  01/11/19 237 lb (107.5 kg)  08/31/18 240 lb (108.9 kg)  07/13/18 240 lb (108.9 kg)   Constitutional: overweight, in NAD Eyes: PERRLA, EOMI, no exophthalmos ENT: moist mucous membranes, no neck masses palpated, thyroidectomy scar healed with minimal keloid, no cervical lymphadenopathy Cardiovascular: RRR, No MRG Respiratory: CTA B Gastrointestinal: abdomen soft, NT, ND, BS+ Musculoskeletal: no deformities, strength intact in all 4 Skin: moist, warm, no rashes Neurological: no tremor with outstretched hands, DTR normal in all 4  ASSESSMENT: 1. Postsurgical Hypothyroidism - on Brand name Synthroid  2. Thyroid Cancer (stage 1 or 2) in remission  3.  Obesity  PLAN:  1. Patient with longstanding, iatrogenic hypothyroidism, after surgery for thyroid cancer. - latest thyroid labs reviewed with pt >> normal at last check in 02/2018 - she continues on LT4 DAW 112 mcg daily - pt feels good on this dose. - we discussed about taking the thyroid hormone every day, with water, >30 minutes before  breakfast, separated by >4 hours from acid reflux medications, calcium, iron, multivitamins. Pt. is taking it correctly.  She had questions about when to take her blood pressure medicines after levothyroxine and I advised her to wait 30 minutes. - will check thyroid tests today: TSH and fT4 - If labs are abnormal, she will need to return for repeat TFTs in 1.5 months  2. PTC -Reviewed history: Whole-body scans were negative up to 2010.  She has had undetectable thyroglobulin levels since then.  She had one instance of elevated ATA antibodies, but 4 sets of ATA antibodies have been negative since. -We will check her thyroglobulin and ATA ([facility]) today -We will also check a neck  ultrasound 10 years after the last whole-body scan-will order today -She denies neck compression symptoms.  3.  Obesity -She was asking me about my opinion of keto diet versus plant-based diet.  I strongly advised her to continue with a plant-based diet, which she already started.  She likes the diet and started to lose weight.  I gave her more reference materials about this diet.  Needs refills of Synthroid.  Component     Latest Ref Rng & Units 01/11/2019  TSH     0.35 - 4.50 uIU/mL 0.15 (L)  T4,Free(Direct)     0.60 - 1.60 ng/dL 1.22  Thyroglobulin Antibody     0.0 - 0.9 IU/mL <1.0  Thyroglobulin by IMA     1.5 - 38.5 ng/mL <0.1 (L)  TSH is suppressed so we will need to decrease the dose to 100 mcg daily.  Thyroglobulin and ATA antibodies are excellent. We will have her back for repeat TFTs in 1.5 months.  Philemon Kingdom, MD PhD [facility]

## 2019-01-11 NOTE — Patient Instructions (Signed)
Please continue Levothyroxine 112 mcg daily.  Take the thyroid hormone every day, with water, at least 30 minutes before breakfast, separated by at least 4 hours from: - acid reflux medications - calcium - iron - multivitamins  Please stop at the lab.  Please come back for a follow-up appointment in 1 year.  Read the following books: [place] 2 diet Dr. Alden Benjamin - How Not to Die

## 2019-01-12 LAB — TGAB+THYROGLOBULIN IMA OR LCMS: Thyroglobulin Antibody: <1 IU/mL (ref 0.0–0.9)

## 2019-01-12 LAB — TSH: TSH: 0.15 u[IU]/mL — ABNORMAL LOW (ref 0.35–4.50)

## 2019-01-12 LAB — T4, FREE: Free T4: 1.22 ng/dL (ref 0.60–1.60)

## 2019-01-12 LAB — THYROGLOBULIN BY IMA

## 2019-01-12 MED ORDER — SYNTHROID 100 MCG PO TABS: 100.0000 ug | ORAL_TABLET | Freq: Every day | ORAL | 3 refills | Status: DC

## 2019-03-06 ENCOUNTER — Encounter: Payer: BLUE CROSS/BLUE SHIELD | Admitting: Internal Medicine

## 2019-04-11 NOTE — Progress Notes (Signed)
Subjective:    Patient ID: Shelly Baldwin, female    DOB: October 28, 1966, 53 y.o.   MRN: [id number]  HPI She is here for a physical exam.   She does check her blood pressure at home medication.  The other day it was 138/91.  She states she has not taken her blood pressure medication today because she was running out the door.  She is up-to-date with her gynecologist-she goes to [facility].  She is still having her menses, but they are irregular and not heavy.  She currently has hers, but it is light.  She is taking iron pill daily.   Medications and allergies reviewed with patient and updated if appropriate.  Patient Active Problem List   Diagnosis Date Noted  . Anemia 08/31/2018  . Menorrhagia with irregular cycle 03/24/2017  . Hypertension 09/17/2015  . Obesity, Class I, BMI 30.0-34.9 (see actual BMI) 12/15/2013  . Hypothyroidism, iatrogenic 08/04/2012  . Thyroid cancer [facility])     Current Outpatient Medications on File Prior to Visit  Medication Sig Dispense Refill  . amLODipine (NORVASC) 5 MG tablet Take 1 tablet (5 mg total) by mouth daily. 90 tablet 1  . fluticasone (FLONASE) 50 MCG/ACT nasal spray Place into both nostrils daily.    . hydrochlorothiazide (HYDRODIURIL) 25 MG tablet TAKE 1 TABLET BY MOUTH EVERY DAY 90 tablet 1  . Loratadine (CLARITIN PO) Take by mouth daily.    Marland Kitchen SYNTHROID 100 MCG tablet Take 1 tablet (100 mcg total) by mouth daily before breakfast. 45 tablet 3   No current facility-administered medications on file prior to visit.     Past Medical History:  Diagnosis Date  . Cancer (HCC)    undifferentiated thryoid  . Thyroid disease     Past Surgical History:  Procedure Laterality Date  . G0P0    . THYROIDECTOMY     2005    Social History   Socioeconomic History  . Marital status: Single    Spouse name: Not on file  . Number of children: 0  . Years of education: 20  . Highest education level: Not on file  Occupational History  .  Occupation: Hotel manager support  Social Needs  . Financial resource strain: Not on file  . Food insecurity:    Worry: Not on file    Inability: Not on file  . Transportation needs:    Medical: Not on file    Non-medical: Not on file  Tobacco Use  . Smoking status: Former Smoker    Last attempt to quit: 08/13/1986    Years since quitting: 32.6  . Smokeless tobacco: Never Used  Substance and Sexual Activity  . Alcohol use: No  . Drug use: No  . Sexual activity: Not on file  Lifestyle  . Physical activity:    Days per week: Not on file    Minutes per session: Not on file  . Stress: Not on file  Relationships  . Social connections:    Talks on phone: Not on file    Gets together: Not on file    Attends religious service: Not on file    Active member of club or organization: Not on file    Attends meetings of clubs or organizations: Not on file    Relationship status: Not on file  Other Topics Concern  . Not on file  Social History Narrative   HSG, [employer] in [place], matriculating [employer] (Sept '12). Single. Work - Loss adjuster, chartered at [employer]. \Do  you exercise at least 3 times a week- twice a week with walking. Is there a smoke alarm in your house- yes. Are there guns and firearms in your household- no . Have you experienced physical abuse-no         Usual # of hours of sleep per night 6-7 hours per night   Number of people living at your residence- Pt lives alone                   Family History  Problem Relation Age of Onset  . Hypertension Father   . Diabetes Father   . Hypothyroidism Father   . Cancer Father        prostate  . Breast cancer Neg Hx     Review of Systems  Constitutional: Negative for chills and fever.  Eyes: Negative for visual disturbance.  Respiratory: Negative for cough, shortness of breath and wheezing.   Cardiovascular: Positive for leg swelling (rare - sitting for long periods). Negative for chest pain and palpitations.   Gastrointestinal: Negative for abdominal pain, blood in stool, constipation, diarrhea and nausea.       No gerd  Genitourinary: Negative for dysuria and hematuria.  Musculoskeletal: Negative for arthralgias and back pain.  Skin: Negative for color change and rash.  Neurological: Negative for dizziness, light-headedness and headaches.  Psychiatric/Behavioral: Negative for dysphoric mood. The patient is nervous/anxious (with COVID).        Objective:   Vitals:   04/12/19 1316  BP: (!) 154/96  Pulse: (!) 55  Resp: 18  Temp: 98.3 F (36.8 C)  SpO2: 99%   Filed Weights   04/12/19 1316  Weight: 246 lb (111.6 kg)   Body mass index is 40.31 kg/m.  BP Readings from Last 3 Encounters:  04/12/19 (!) 154/96  01/11/19 140/90  08/31/18 140/82    Wt Readings from Last 3 Encounters:  04/12/19 246 lb (111.6 kg)  01/11/19 237 lb (107.5 kg)  08/31/18 240 lb (108.9 kg)     Physical Exam Constitutional: She appears well-developed and well-nourished. No distress.  HENT:  Head: Normocephalic and atraumatic.  Right Ear: External ear normal. Normal ear canal and TM Left Ear: External ear normal.  Normal ear canal and TM Mouth/Throat: Oropharynx is clear and moist.  Eyes: Conjunctivae and EOM are normal.  Neck: Neck supple. No tracheal deviation present. No thyromegaly present.  No carotid bruit  Cardiovascular: Normal rate, regular rhythm and normal heart sounds.   1/6 systolic murmur heard.  No edema. Pulmonary/Chest: Effort normal and breath sounds normal. No respiratory distress. She has no wheezes. She has no rales.  Breast: deferred   Abdominal: Obese.  Soft. She exhibits no distension. There is no tenderness.  Lymphadenopathy: She has no cervical adenopathy.  Skin: Skin is warm and dry. She is not diaphoretic.  Psychiatric: She has a normal mood and affect. Her behavior is normal.        Assessment & Plan:   Physical exam: Screening blood work ordered Immunizations due  for tetanus today, influenza up-to-date Colonoscopy   Never had one Mammogram  Up to date  Gyn  Up to date  Eye exams  Up to date  Exercise  2/week Weight stressed the importance of weight loss Skin   no skin concerns Substance abuse   none  See Problem List for Assessment and Plan of chronic medical problems.   Follow-up in 6 months.  Advised her to send me her blood pressure readings via  my chart

## 2019-04-12 ENCOUNTER — Other Ambulatory Visit (INDEPENDENT_AMBULATORY_CARE_PROVIDER_SITE_OTHER): Payer: BLUE CROSS/BLUE SHIELD

## 2019-04-12 ENCOUNTER — Ambulatory Visit (INDEPENDENT_AMBULATORY_CARE_PROVIDER_SITE_OTHER): Payer: BLUE CROSS/BLUE SHIELD | Admitting: Internal Medicine

## 2019-04-12 ENCOUNTER — Encounter: Payer: Self-pay | Admitting: Internal Medicine

## 2019-04-12 VITALS — BP 154/96 | HR 55 | Temp 98.3°F | Resp 18 | Ht 65.5 in | Wt 246.0 lb

## 2019-04-12 DIAGNOSIS — N921 Excessive and frequent menstruation with irregular cycle: Secondary | ICD-10-CM

## 2019-04-12 DIAGNOSIS — Z0000 Encounter for general adult medical examination without abnormal findings: Secondary | ICD-10-CM | POA: Diagnosis not present

## 2019-04-12 DIAGNOSIS — R739 Hyperglycemia, unspecified: Secondary | ICD-10-CM

## 2019-04-12 DIAGNOSIS — D508 Other iron deficiency anemias: Secondary | ICD-10-CM | POA: Diagnosis not present

## 2019-04-12 DIAGNOSIS — Z6841 Body Mass Index (BMI) 40.0 and over, adult: Secondary | ICD-10-CM

## 2019-04-12 DIAGNOSIS — I10 Essential (primary) hypertension: Secondary | ICD-10-CM

## 2019-04-12 DIAGNOSIS — Z1211 Encounter for screening for malignant neoplasm of colon: Secondary | ICD-10-CM | POA: Diagnosis not present

## 2019-04-12 DIAGNOSIS — E66813 Obesity, class 3: Secondary | ICD-10-CM

## 2019-04-12 DIAGNOSIS — E032 Hypothyroidism due to medicaments and other exogenous substances: Secondary | ICD-10-CM

## 2019-04-12 DIAGNOSIS — C73 Malignant neoplasm of thyroid gland: Secondary | ICD-10-CM

## 2019-04-12 LAB — LIPID PANEL
Cholesterol: 235 mg/dL — ABNORMAL HIGH (ref 0–200)
HDL: 61 mg/dL (ref >39.00)
LDL Cholesterol: 145 mg/dL — ABNORMAL HIGH (ref 0–99)
NonHDL: 173.96
Total CHOL/HDL Ratio: 4
Triglycerides: 146 mg/dL (ref 0.0–149.0)
VLDL: 29.2 mg/dL (ref 0.0–40.0)

## 2019-04-12 LAB — COMPREHENSIVE METABOLIC PANEL
ALT: 17 U/L (ref 0–35)
AST: 16 U/L (ref 0–37)
Albumin: 4.7 g/dL (ref 3.5–5.2)
Alkaline Phosphatase: 67 U/L (ref 39–117)
BUN: 15 mg/dL (ref 6–23)
CO2: 31 mEq/L (ref 19–32)
Calcium: 10.3 mg/dL (ref 8.4–10.5)
Chloride: 98 mEq/L (ref 96–112)
Creatinine, Ser: 1.01 mg/dL (ref 0.40–1.20)
GFR: 57.2 mL/min — ABNORMAL LOW (ref >60.00)
Glucose, Bld: 83 mg/dL (ref 70–99)
Potassium: 3.4 mEq/L — ABNORMAL LOW (ref 3.5–5.1)
Sodium: 138 mEq/L (ref 135–145)
Total Bilirubin: 0.6 mg/dL (ref 0.2–1.2)
Total Protein: 7.6 g/dL (ref 6.0–8.3)

## 2019-04-12 LAB — CBC WITH DIFFERENTIAL/PLATELET
Basophils Absolute: 0.1 10*3/uL (ref 0.0–0.1)
Basophils Relative: 1.2 % (ref 0.0–3.0)
Eosinophils Absolute: 0.1 10*3/uL (ref 0.0–0.7)
Eosinophils Relative: 3.3 % (ref 0.0–5.0)
HCT: 39.7 % (ref 36.0–46.0)
Hemoglobin: 13.8 g/dL (ref 12.0–15.0)
Lymphocytes Relative: 27 % (ref 12.0–46.0)
Lymphs Abs: 1.2 10*3/uL (ref 0.7–4.0)
MCHC: 34.8 g/dL (ref 30.0–36.0)
MCV: 95.3 fl (ref 78.0–100.0)
Monocytes Absolute: 0.4 10*3/uL (ref 0.1–1.0)
Monocytes Relative: 8.2 % (ref 3.0–12.0)
Neutro Abs: 2.6 10*3/uL (ref 1.4–7.7)
Neutrophils Relative %: 60.3 % (ref 43.0–77.0)
Platelets: 261 10*3/uL (ref 150.0–400.0)
RBC: 4.17 Mil/uL (ref 3.87–5.11)
RDW: 14.7 % (ref 11.5–15.5)
WBC: 4.3 10*3/uL (ref 4.0–10.5)

## 2019-04-12 LAB — HEMOGLOBIN A1C: Hgb A1c MFr Bld: 5.1 % (ref 4.6–6.5)

## 2019-04-12 MED ORDER — HYDROCHLOROTHIAZIDE 25 MG PO TABS: 25.0000 mg | ORAL_TABLET | Freq: Every day | ORAL | 1 refills | Status: DC

## 2019-04-12 MED ORDER — AMLODIPINE BESYLATE 5 MG PO TABS: 5.0000 mg | ORAL_TABLET | Freq: Two times a day (BID) | ORAL | 1 refills | Status: DC

## 2019-04-12 NOTE — Patient Instructions (Addendum)
Tests ordered today. Your results will be released to [place] (or called to you) after review, usually within 72hours after test completion. If any changes need to be made, you will be notified at that same time.  All other Health Maintenance issues reviewed.   All recommended immunizations and age-appropriate screenings are up-to-date or discussed.  No immunizations administered today.   Medications reviewed and updated.  Changes include :   Increasing amlodipine to 10 mg daily.   Your prescription(s) have been submitted to your pharmacy. Please take as directed and contact our office if you believe you are having problem(s) with the medication(s).  Please followup in 6 months   Health Maintenance, Female Adopting a healthy lifestyle and getting preventive care can go a long way to promote health and wellness. Talk with your health care provider about what schedule of regular examinations is right for you. This is a good chance for you to check in with your provider about disease prevention and staying healthy. In between checkups, there are plenty of things you can do on your own. Experts have done a lot of research about which lifestyle changes and preventive measures are most likely to keep you healthy. Ask your health care provider for more information. Weight and diet Eat a healthy diet  Be sure to include plenty of vegetables, fruits, low-fat dairy products, and lean protein.  Do not eat a lot of foods high in solid fats, added sugars, or salt.  Get regular exercise. This is one of the most important things you can do for your health. ? Most adults should exercise for at least 150 minutes each week. The exercise should increase your heart rate and make you sweat (moderate-intensity exercise). ? Most adults should also do strengthening exercises at least twice a week. This is in addition to the moderate-intensity exercise. Maintain a healthy weight  Body mass index (BMI) is a  measurement that can be used to identify possible weight problems. It estimates body fat based on height and weight. Your health care provider can help determine your BMI and help you achieve or maintain a healthy weight.  For females 66 years of age and older: ? A BMI below 18.5 is considered underweight. ? A BMI of 18.5 to 24.9 is normal. ? A BMI of 25 to 29.9 is considered overweight. ? A BMI of 30 and above is considered obese. Watch levels of cholesterol and blood lipids  You should start having your blood tested for lipids and cholesterol at 54 years of age, then have this test every 5 years.  You may need to have your cholesterol levels checked more often if: ? Your lipid or cholesterol levels are high. ? You are older than 54 years of age. ? You are at high risk for heart disease. Cancer screening Lung Cancer  Lung cancer screening is recommended for adults 51-79 years old who are at high risk for lung cancer because of a history of smoking.  A yearly low-dose CT scan of the lungs is recommended for people who: ? Currently smoke. ? Have quit within the past 15 years. ? Have at least a 30-pack-year history of smoking. A pack year is smoking an average of one pack of cigarettes a day for 1 year.  Yearly screening should continue until it has been 15 years since you quit.  Yearly screening should stop if you develop a health problem that would prevent you from having lung cancer treatment. Breast Cancer  Practice breast  self-awareness. This means understanding how your breasts normally appear and feel.  It also means doing regular breast self-exams. Let your health care provider know about any changes, no matter how small.  If you are in your 20s or 30s, you should have a clinical breast exam (CBE) by a health care provider every 1-3 years as part of a regular health exam.  If you are 76 or older, have a CBE every year. Also consider having a breast X-ray (mammogram) every  year.  If you have a family history of breast cancer, talk to your health care provider about genetic screening.  If you are at high risk for breast cancer, talk to your health care provider about having an MRI and a mammogram every year.  Breast cancer gene (BRCA) assessment is recommended for women who have family members with BRCA-related cancers. BRCA-related cancers include: ? Breast. ? Ovarian. ? Tubal. ? Peritoneal cancers.  Results of the assessment will determine the need for genetic counseling and BRCA1 and BRCA2 testing. Cervical Cancer Your health care provider may recommend that you be screened regularly for cancer of the pelvic organs (ovaries, uterus, and vagina). This screening involves a pelvic examination, including checking for microscopic changes to the surface of your cervix (Pap test). You may be encouraged to have this screening done every 3 years, beginning at age 55.  For women ages 70-65, health care providers may recommend pelvic exams and Pap testing every 3 years, or they may recommend the Pap and pelvic exam, combined with testing for human papilloma virus (HPV), every 5 years. Some types of HPV increase your risk of cervical cancer. Testing for HPV may also be done on women of any age with unclear Pap test results.  Other health care providers may not recommend any screening for nonpregnant women who are considered low risk for pelvic cancer and who do not have symptoms. Ask your health care provider if a screening pelvic exam is right for you.  If you have had past treatment for cervical cancer or a condition that could lead to cancer, you need Pap tests and screening for cancer for at least 20 years after your treatment. If Pap tests have been discontinued, your risk factors (such as having a new sexual partner) need to be reassessed to determine if screening should resume. Some women have medical problems that increase the chance of getting cervical cancer. In  these cases, your health care provider may recommend more frequent screening and Pap tests. Colorectal Cancer  This type of cancer can be detected and often prevented.  Routine colorectal cancer screening usually begins at 54 years of age and continues through 54 years of age.  Your health care provider may recommend screening at an earlier age if you have risk factors for colon cancer.  Your health care provider may also recommend using home test kits to check for hidden blood in the stool.  A small camera at the end of a tube can be used to examine your colon directly (sigmoidoscopy or colonoscopy). This is done to check for the earliest forms of colorectal cancer.  Routine screening usually begins at age 39.  Direct examination of the colon should be repeated every 5-10 years through 54 years of age. However, you may need to be screened more often if early forms of precancerous polyps or small growths are found. Skin Cancer  Check your skin from head to toe regularly.  Tell your health care provider about any new moles  or changes in moles, especially if there is a change in a mole's shape or color.  Also tell your health care provider if you have a mole that is larger than the size of a pencil eraser.  Always use sunscreen. Apply sunscreen liberally and repeatedly throughout the day.  Protect yourself by wearing long sleeves, pants, a wide-brimmed hat, and sunglasses whenever you are outside. Heart disease, diabetes, and high blood pressure  High blood pressure causes heart disease and increases the risk of stroke. High blood pressure is more likely to develop in: ? People who have blood pressure in the high end of the normal range (130-139/85-89 mm Hg). ? People who are overweight or obese. ? People who are African American.  If you are 73-100 years of age, have your blood pressure checked every 3-5 years. If you are 47 years of age or older, have your blood pressure checked  every year. You should have your blood pressure measured twice-once when you are at a hospital or clinic, and once when you are not at a hospital or clinic. Record the average of the two measurements. To check your blood pressure when you are not at a hospital or clinic, you can use: ? An automated blood pressure machine at a pharmacy. ? A home blood pressure monitor.  If you are between 43 years and 64 years old, ask your health care provider if you should take aspirin to prevent strokes.  Have regular diabetes screenings. This involves taking a blood sample to check your fasting blood sugar level. ? If you are at a normal weight and have a low risk for diabetes, have this test once every three years after 54 years of age. ? If you are overweight and have a high risk for diabetes, consider being tested at a younger age or more often. Preventing infection Hepatitis B  If you have a higher risk for hepatitis B, you should be screened for this virus. You are considered at high risk for hepatitis B if: ? You were born in a country where hepatitis B is common. Ask your health care provider which countries are considered high risk. ? Your parents were born in a high-risk country, and you have not been immunized against hepatitis B (hepatitis B vaccine). ? You have HIV or AIDS. ? You use needles to inject street drugs. ? You live with someone who has hepatitis B. ? You have had sex with someone who has hepatitis B. ? You get hemodialysis treatment. ? You take certain medicines for conditions, including cancer, organ transplantation, and autoimmune conditions. Hepatitis C  Blood testing is recommended for: ? Everyone born from [birth year] through 1966. ? Anyone with known risk factors for hepatitis C. Sexually transmitted infections (STIs)  You should be screened for sexually transmitted infections (STIs) including gonorrhea and chlamydia if: ? You are sexually active and are younger than 54 years of  age. ? You are older than 54 years of age and your health care provider tells you that you are at risk for this type of infection. ? Your sexual activity has changed since you were last screened and you are at an increased risk for chlamydia or gonorrhea. Ask your health care provider if you are at risk.  If you do not have HIV, but are at risk, it may be recommended that you take a prescription medicine daily to prevent HIV infection. This is called pre-exposure prophylaxis (PrEP). You are considered at risk if: ? You are  sexually active and do not regularly use condoms or know the HIV status of your partner(s). ? You take drugs by injection. ? You are sexually active with a partner who has HIV. Talk with your health care provider about whether you are at high risk of being infected with HIV. If you choose to begin PrEP, you should first be tested for HIV. You should then be tested every 3 months for as long as you are taking PrEP. Pregnancy  If you are premenopausal and you may become pregnant, ask your health care provider about preconception counseling.  If you may become pregnant, take 400 to 800 micrograms (mcg) of folic acid every day.  If you want to prevent pregnancy, talk to your health care provider about birth control (contraception). Osteoporosis and menopause  Osteoporosis is a disease in which the bones lose minerals and strength with aging. This can result in serious bone fractures. Your risk for osteoporosis can be identified using a bone density scan.  If you are 54 years of age or older, or if you are at risk for osteoporosis and fractures, ask your health care provider if you should be screened.  Ask your health care provider whether you should take a calcium or vitamin D supplement to lower your risk for osteoporosis.  Menopause may have certain physical symptoms and risks.  Hormone replacement therapy may reduce some of these symptoms and risks. Talk to your health  care provider about whether hormone replacement therapy is right for you. Follow these instructions at home:  Schedule regular health, dental, and eye exams.  Stay current with your immunizations.  Do not use any tobacco products including cigarettes, chewing tobacco, or electronic cigarettes.  If you are pregnant, do not drink alcohol.  If you are breastfeeding, limit how much and how often you drink alcohol.  Limit alcohol intake to no more than 1 drink per day for nonpregnant women. One drink equals 12 ounces of beer, 5 ounces of wine, or 1 ounces of hard liquor.  Do not use street drugs.  Do not share needles.  Ask your health care provider for help if you need support or information about quitting drugs.  Tell your health care provider if you often feel depressed.  Tell your health care provider if you have ever been abused or do not feel safe at home. This information is not intended to replace advice given to you by your health care provider. Make sure you discuss any questions you have with your health care provider. Document Released: 05/18/2011 Document Revised: 04/09/2016 Document Reviewed: 08/06/2015 Elsevier Interactive Patient Education  2019 [employer].

## 2019-04-12 NOTE — Assessment & Plan Note (Addendum)
Irregular periods, has it now - it is not heavy - first time since December Following with GYN

## 2019-04-12 NOTE — Assessment & Plan Note (Signed)
Management per Dr Gherghe 

## 2019-04-12 NOTE — Assessment & Plan Note (Addendum)
Iron deficiency, related to menorrhagia Currently having light, sporadic periods and she is taking iron daily GYNs plan since she is perimenopausal is no treatment is needed and just monitor She has never had a colonoscopy and still needs a colonoscopy-referred CBC, iron panel

## 2019-04-12 NOTE — Assessment & Plan Note (Signed)
BMI 40.31 With hypertension, not currently well controlled Stressed the importance of weight loss Discussed the importance of regular exercise-needs to be exercising more than 2 years Discussed healthy diet, decrease portions At risk for diabetes given her family history Follow-up in 6 months

## 2019-04-12 NOTE — Assessment & Plan Note (Signed)
Blood pressure not ideally controlled here or at home Continue hydrochlorothiazide 25 mg daily Will increase amlodipine to 10 mg daily-if this causes leg swelling we will go back to 5 mg daily and consider adding a different medication-possibly Bystolic CMP

## 2019-04-13 ENCOUNTER — Encounter: Payer: Self-pay | Admitting: Internal Medicine

## 2019-04-13 LAB — IRON,TIBC AND FERRITIN PANEL
%SAT: 23 % (calc) (ref 16–45)
Ferritin: 30 ng/mL (ref 16–232)
Iron: 97 ug/dL (ref 45–160)
TIBC: 416 mcg/dL (calc) (ref 250–450)

## 2019-04-25 ENCOUNTER — Encounter: Payer: Self-pay | Admitting: Internal Medicine

## 2019-05-17 ENCOUNTER — Encounter: Payer: Self-pay | Admitting: Internal Medicine

## 2019-05-18 ENCOUNTER — Encounter: Payer: Self-pay | Admitting: Internal Medicine

## 2019-06-10 ENCOUNTER — Other Ambulatory Visit: Payer: Self-pay | Admitting: Internal Medicine

## 2019-07-04 ENCOUNTER — Other Ambulatory Visit: Payer: Self-pay | Admitting: Internal Medicine

## 2019-07-04 DIAGNOSIS — E032 Hypothyroidism due to medicaments and other exogenous substances: Secondary | ICD-10-CM

## 2019-07-18 ENCOUNTER — Ambulatory Visit: Payer: Self-pay

## 2019-07-18 ENCOUNTER — Encounter: Payer: Self-pay | Admitting: Internal Medicine

## 2019-07-18 ENCOUNTER — Other Ambulatory Visit: Payer: Self-pay

## 2019-07-18 ENCOUNTER — Ambulatory Visit (INDEPENDENT_AMBULATORY_CARE_PROVIDER_SITE_OTHER): Payer: BC Managed Care – PPO | Admitting: Internal Medicine

## 2019-07-18 VITALS — BP 164/100 | HR 67 | Temp 98.9°F | Resp 16 | Ht 65.5 in | Wt 243.0 lb

## 2019-07-18 DIAGNOSIS — H8111 Benign paroxysmal vertigo, right ear: Secondary | ICD-10-CM | POA: Diagnosis not present

## 2019-07-18 DIAGNOSIS — Z23 Encounter for immunization: Secondary | ICD-10-CM | POA: Diagnosis not present

## 2019-07-18 NOTE — Patient Instructions (Signed)
Start taking the Claritin daily.  You can try non-drowsy dramamine.  Avoid turning your head to the right.    If the symptoms worsen let me know if you want to try meclizine which helps with the symptoms of the dizziness.     You can try the Epley Maneuver or try PT.  Let me know if you want a referral to PT.     Benign Positional Vertigo Vertigo is the feeling that you or your surroundings are moving when they are not. Benign positional vertigo is the most common form of vertigo. This is usually a harmless condition (benign). This condition is positional. This means that symptoms are triggered by certain movements and positions. This condition can be dangerous if it occurs while you are doing something that could cause harm to you or others. This includes activities such as driving or operating machinery. What are the causes? In many cases, the cause of this condition is not known. It may be caused by a disturbance in an area of the inner ear that helps your brain to sense movement and balance. This disturbance can be caused by:  Viral infection (labyrinthitis).  Head injury.  Repetitive motion, such as jumping, dancing, or running. What increases the risk? You are more likely to develop this condition if:  You are a woman.  You are 87 years of age or older. What are the signs or symptoms? Symptoms of this condition usually happen when you move your head or your eyes in different directions. Symptoms may start suddenly, and usually last for less than a minute. They include:  Loss of balance and falling.  Feeling like you are spinning or moving.  Feeling like your surroundings are spinning or moving.  Nausea and vomiting.  Blurred vision.  Dizziness.  Involuntary eye movement (nystagmus). Symptoms can be mild and cause only minor problems, or they can be severe and interfere with daily life. Episodes of benign positional vertigo may return (recur) over time. Symptoms may  improve over time. How is this diagnosed? This condition may be diagnosed based on:  Your medical history.  Physical exam of the head, neck, and ears.  Tests, such as: ? MRI. ? CT scan. ? Eye movement tests. Your health care provider may ask you to change positions quickly while he or she watches you for symptoms of benign positional vertigo, such as nystagmus. Eye movement may be tested with a variety of exams that are designed to evaluate or stimulate vertigo. ? An electroencephalogram (EEG). This records electrical activity in your brain. ? Hearing tests. You may be referred to a health care provider who specializes in ear, nose, and throat (ENT) problems (otolaryngologist) or a provider who specializes in disorders of the nervous system (neurologist). How is this treated?  This condition may be treated in a session in which your health care provider moves your head in specific positions to adjust your inner ear back to normal. Treatment for this condition may take several sessions. Surgery may be needed in severe cases, but this is rare. In some cases, benign positional vertigo may resolve on its own in 2-4 weeks. Follow these instructions at home: Safety  Move slowly. Avoid sudden body or head movements or certain positions, as told by your health care provider.  Avoid driving until your health care provider says it is safe for you to do so.  Avoid operating heavy machinery until your health care provider says it is safe for you to do so.  Avoid doing any tasks that would be dangerous to you or others if vertigo occurs.  If you have trouble walking or keeping your balance, try using a cane for stability. If you feel dizzy or unstable, sit down right away.  Return to your normal activities as told by your health care provider. Ask your health care provider what activities are safe for you. General instructions  Take over-the-counter and prescription medicines only as told by  your health care provider.  Drink enough fluid to keep your urine pale yellow.  Keep all follow-up visits as told by your health care provider. This is important. Contact a health care provider if:  You have a fever.  Your condition gets worse or you develop new symptoms.  Your family or friends notice any behavioral changes.  You have nausea or vomiting that gets worse.  You have numbness or a "pins and needles" sensation. Get help right away if you:  Have difficulty speaking or moving.  Are always dizzy.  Faint.  Develop severe headaches.  Have weakness in your legs or arms.  Have changes in your hearing or vision.  Develop a stiff neck.  Develop sensitivity to light. Summary  Vertigo is the feeling that you or your surroundings are moving when they are not. Benign positional vertigo is the most common form of vertigo.  The cause of this condition is not known. It may be caused by a disturbance in an area of the inner ear that helps your brain to sense movement and balance.  Symptoms include loss of balance and falling, feeling that you or your surroundings are moving, nausea and vomiting, and blurred vision.  This condition can be diagnosed based on symptoms, physical exam, and other tests, such as MRI, CT scan, eye movement tests, and hearing tests.  Follow safety instructions as told by your health care provider. You will also be told when to contact your health care provider in case of problems. This information is not intended to replace advice given to you by your health care provider. Make sure you discuss any questions you have with your health care provider. Document Released: 08/10/2006 Document Revised: 04/13/2018 Document Reviewed: 04/13/2018 Elsevier Patient Education  2020 [employer].

## 2019-07-18 NOTE — Progress Notes (Signed)
Subjective:    Patient ID: Shelly Baldwin, female    DOB: 1966-09-01, 53 y.o.   MRN: [id number]  HPI The patient is here for an acute visit.   Dizziness:  She started having dizziness 4 days ago.  She was in bed and when she she turned to the right the room started to spin.  It was transient only lasting a few seconds.  She was ok until this morning.  She again turned to the right and she had spinning again, which was faster than the first time.  It was transient and only lasted a few seconds.  She has not had any vertigo since then.  She has been incredibly anxious about this and feels that is why her blood pressure is elevated.  She has not had any spinning when she looks left, up or down.  She denies any issues with her balance.  She has not noted any changes in vision, nausea, numbness/tingling or weakness in her arms or legs.  She has not had any prior episodes.  She had a transient headache with her first episode of dizziness, but none since.  She does have seasonal allergies.  She takes Claritin and Flonase as needed.  She has had some nasal congestion, which was worse last week.  She has not had any other cold symptoms.     BP sat night at home- 136/85    Medications and allergies reviewed with patient and updated if appropriate.  Patient Active Problem List   Diagnosis Date Noted  . BPPV (benign paroxysmal positional vertigo), right 07/18/2019  . Anemia 08/31/2018  . Menorrhagia with irregular cycle 03/24/2017  . Hypertension 09/17/2015  . Obese 12/15/2013  . Hypothyroidism, iatrogenic 08/04/2012  . Thyroid cancer [facility])     Current Outpatient Medications on File Prior to Visit  Medication Sig Dispense Refill  . amLODipine (NORVASC) 5 MG tablet Take 1 tablet (5 mg total) by mouth 2 (two) times a day. 180 tablet 1  . fluticasone (FLONASE) 50 MCG/ACT nasal spray Place into both nostrils daily.    . hydrochlorothiazide (HYDRODIURIL) 25 MG tablet Take 1 tablet (25 mg total)  by mouth daily. 90 tablet 1  . Loratadine (CLARITIN PO) Take by mouth daily.    Marland Kitchen SYNTHROID 100 MCG tablet TAKE 1 TABLET (100 MCG TOTAL) BY MOUTH DAILY BEFORE BREAKFAST. 45 tablet 3   No current facility-administered medications on file prior to visit.     Past Medical History:  Diagnosis Date  . Cancer (HCC)    undifferentiated thryoid  . Thyroid disease     Past Surgical History:  Procedure Laterality Date  . G0P0    . THYROIDECTOMY     2005    Social History   Socioeconomic History  . Marital status: Single    Spouse name: Not on file  . Number of children: 0  . Years of education: 5  . Highest education level: Not on file  Occupational History  . Occupation: Hotel manager support  Social Needs  . Financial resource strain: Not on file  . Food insecurity    Worry: Not on file    Inability: Not on file  . Transportation needs    Medical: Not on file    Non-medical: Not on file  Tobacco Use  . Smoking status: Former Smoker    Quit date: 08/13/1986    Years since quitting: 32.9  . Smokeless tobacco: Never Used  Substance and Sexual Activity  . Alcohol use:  No  . Drug use: No  . Sexual activity: Not on file  Lifestyle  . Physical activity    Days per week: Not on file    Minutes per session: Not on file  . Stress: Not on file  Relationships  . Social Herbalist on phone: Not on file    Gets together: Not on file    Attends religious service: Not on file    Active member of club or organization: Not on file    Attends meetings of clubs or organizations: Not on file    Relationship status: Not on file  Other Topics Concern  . Not on file  Social History Narrative   HSG, [employer] in [place], matriculating [employer] (Sept '12). Single. Work - Loss adjuster, chartered at [employer]. \Do you exercise at least 3 times a week- twice a week with walking. Is there a smoke alarm in your house- yes. Are there guns and firearms in your household- no . Have you  experienced physical abuse-no         Usual # of hours of sleep per night 6-7 hours per night   Number of people living at your residence- Pt lives alone                   Family History  Problem Relation Age of Onset  . Hypertension Father   . Diabetes Father   . Hypothyroidism Father   . Cancer Father        prostate  . Breast cancer Mother 46    Review of Systems  Constitutional: Negative for chills and fever.  HENT: Positive for congestion (allergies - worse last week). Negative for ear pain, postnasal drip, sinus pressure, sinus pain and sore throat.   Eyes: Negative for visual disturbance.  Respiratory: Negative for cough, shortness of breath and wheezing.   Cardiovascular: Negative for chest pain and palpitations.  Gastrointestinal: Negative for nausea.  Neurological: Positive for dizziness and headaches (mild with first episode). Negative for weakness and numbness.       Objective:   Vitals:   07/18/19 1420  BP: (!) 164/100  Pulse: 67  Resp: 16  Temp: 98.9 F (37.2 C)  SpO2: 98%   BP Readings from Last 3 Encounters:  07/18/19 (!) 164/100  04/12/19 (!) 154/96  01/11/19 140/90   Wt Readings from Last 3 Encounters:  07/18/19 243 lb (110.2 kg)  04/12/19 246 lb (111.6 kg)  01/11/19 237 lb (107.5 kg)   Body mass index is 39.82 kg/m.   Physical Exam Constitutional:      General: She is not in acute distress.    Appearance: Normal appearance. She is not ill-appearing.  HENT:     Head: Normocephalic and atraumatic.     Right Ear: Tympanic membrane, ear canal and external ear normal.     Left Ear: Tympanic membrane, ear canal and external ear normal.     Nose: Nose normal.     Mouth/Throat:     Mouth: Mucous membranes are moist.  Eyes:     Conjunctiva/sclera: Conjunctivae normal.     Comments: No nystagmus  Neck:     Musculoskeletal: Neck supple. No muscular tenderness.  Cardiovascular:     Rate and Rhythm: Normal rate and regular rhythm.      Heart sounds: No murmur.  Pulmonary:     Effort: Pulmonary effort is normal.     Breath sounds: Normal breath sounds.  Musculoskeletal:  Right lower leg: No edema.     Left lower leg: No edema.  Lymphadenopathy:     Cervical: No cervical adenopathy.  Skin:    General: Skin is warm and dry.  Neurological:     General: No focal deficit present.     Mental Status: She is alert.     Sensory: No sensory deficit.     Motor: No weakness.     Gait: Gait normal.            Assessment & Plan:    See Problem List for Assessment and Plan of chronic medical problems.

## 2019-07-18 NOTE — Telephone Encounter (Signed)
Incoming call from Patient. With a complaint of feeling dizzy .  States the room is tiltingand spinning at times.   It only appears that it happens when  down.  Rates it mild.  Onset last week. Aggravating factors  Movement of head.   Hr rate 66 B/P  156/88.  Reports that she  Drinks 5 to 6 of a 26 oz.  bottle of water every day. Transferred to office to make an appoinment.          Reason for Disposition . [1] MODERATE dizziness (e.g., interferes with normal activities) AND [2] has NOT been evaluated by physician for this  (Exception: dizziness caused by heat exposure, sudden standing, or poor fluid intake)  Answer Assessment - Initial Assessment Questions 1. DESCRIPTION: "Describe your dizziness."     Only when laying down 2. LIGHTHEADED: "Do you feel lightheaded?" (e.g., somewhat faint, woozy, weak upon standing)     *No Answer* 3. VERTIGO: "Do you feel like either you or the room is spinning or tilting?" (i.e. vertigo)    yes 4. SEVERITY: "How bad is it?"  "Do you feel like you are going to faint?" "Can you stand and walk?"   - MILD - walking normally   - MODERATE - interferes with normal activities (e.g., work, school)    - SEVERE - unable to stand, requires support to walk, feels like passing out now.     mild 5. ONSET:  "When did the dizziness begin?"      Last week 6. AGGRAVATING FACTORS: "Does anything make it worse?" (e.g., standing, change in head position)     Change in head position 7. HEART RATE: "Can you tell me your heart rate?" "How many beats in 15 seconds?"  (Note: not all patients can do this)       HR 66 156/88 8. CAUSE: "What do you think is causing the dizziness?"     *No Answer* 9. RECURRENT SYMPTOM: "Have you had dizziness before?" If so, ask: "When was the last time?" "What happened that time?"     *No Answer* 10. OTHER SYMPTOMS: "Do you have any other symptoms?" (e.g., fever, chest pain, vomiting, diarrhea, bleeding)       *No Answer* 11. PREGNANCY: "Is  there any chance you are pregnant?" "When was your last menstrual period?"       *No Answer*  Protocols used: DIZZINESS Heidi Dach

## 2019-07-18 NOTE — Telephone Encounter (Signed)
Scheduled

## 2019-07-18 NOTE — Assessment & Plan Note (Signed)
Symptoms consistent with BPPV Discussed causes-labyrinthitis versus crystal disease She is only had 2 episodes may been very transient Discussed meclizine and physical therapy, which she deferred at this time Can try the Epley maneuver at home Advised her to start taking Claritin daily to see if that helps Discussed that this is not serious, but annoying and possibly disabling depending on how severe it is If there is no improvement she will call

## 2019-07-22 ENCOUNTER — Other Ambulatory Visit: Payer: Self-pay

## 2019-07-22 ENCOUNTER — Ambulatory Visit: Payer: BC Managed Care – PPO

## 2019-08-13 DIAGNOSIS — H9201 Otalgia, right ear: Secondary | ICD-10-CM | POA: Diagnosis not present

## 2019-10-16 ENCOUNTER — Ambulatory Visit: Payer: BLUE CROSS/BLUE SHIELD | Admitting: Internal Medicine

## 2019-10-17 NOTE — Progress Notes (Signed)
Subjective:    Patient ID: Shelly Baldwin, female    DOB: 09-04-1966, 53 y.o.   MRN: [id number]  HPI The patient is here for follow up.  She is exercising regularly - started going to gym once a week.    Hypertension: She is taking her medication daily, but has only been taking amlodipine once a day -- she will start taking it twice a day. She is compliant with a low sodium diet.  She denies chest pain, palpitations, edema, shortness of breath and regular headaches. She does not monitor her blood pressure at home.    Anemia, related to chronic blood loss due to menses: She is not having periods.  She does experience some hot flashes.  She is following with her gynecologist.  vertigo - she has occ vertigo and has been using an otc essenial oil for vertigo and it helps.  Obesity: She is recently restarted regular exercise-she is going to the gym once a week.  She feels she does eat fairly healthy, but may eat 2 larger portions.  Medications and allergies reviewed with patient and updated if appropriate.  Patient Active Problem List   Diagnosis Date Noted   BPPV (benign paroxysmal positional vertigo), right 07/18/2019   Anemia 08/31/2018   Menorrhagia with irregular cycle 03/24/2017   Hypertension 09/17/2015   Obese 12/15/2013   Hypothyroidism, iatrogenic 08/04/2012   Thyroid cancer ([place])     Current Outpatient Medications on File Prior to Visit  Medication Sig Dispense Refill   amLODipine (NORVASC) 5 MG tablet Take 1 tablet (5 mg total) by mouth 2 (two) times a day. 180 tablet 1   fluticasone (FLONASE) 50 MCG/ACT nasal spray Place into both nostrils daily.     hydrochlorothiazide (HYDRODIURIL) 25 MG tablet Take 1 tablet (25 mg total) by mouth daily. 90 tablet 1   Loratadine (CLARITIN PO) Take by mouth daily.     SYNTHROID 100 MCG tablet TAKE 1 TABLET (100 MCG TOTAL) BY MOUTH DAILY BEFORE BREAKFAST. 45 tablet 3   No current facility-administered medications on file  prior to visit.     Past Medical History:  Diagnosis Date   Cancer ([place])    undifferentiated thryoid   Thyroid disease     Past Surgical History:  Procedure Laterality Date   G0P0     THYROIDECTOMY     2005    Social History   Socioeconomic History   Marital status: Single    Spouse name: Not on file   Number of children: 0   Years of education: 14   Highest education level: Not on file  Occupational History   Occupation: Hotel manager support  Social Needs   Financial resource strain: Not on file   Food insecurity    Worry: Not on file    Inability: Not on file   Transportation needs    Medical: Not on file    Non-medical: Not on file  Tobacco Use   Smoking status: Former Smoker    Quit date: 08/13/1986    Years since quitting: 33.2   Smokeless tobacco: Never Used  Substance and Sexual Activity   Alcohol use: No   Drug use: No   Sexual activity: Not on file  Lifestyle   Physical activity    Days per week: Not on file    Minutes per session: Not on file   Stress: Not on file  Relationships   Social connections    Talks on phone: Not on file  Gets together: Not on file    Attends religious service: Not on file    Active member of club or organization: Not on file    Attends meetings of clubs or organizations: Not on file    Relationship status: Not on file  Other Topics Concern   Not on file  Social History Narrative   HSG, [employer] in [place], matriculating [employer] (Sept '12). Single. Work - Loss adjuster, chartered at [employer]. \Do you exercise at least 3 times a week- twice a week with walking. Is there a smoke alarm in your house- yes. Are there guns and firearms in your household- no . Have you experienced physical abuse-no         Usual # of hours of sleep per night 6-7 hours per night   Number of people living at your residence- Pt lives alone                   Family History  Problem Relation Age of Onset   Hypertension  Father    Diabetes Father    Hypothyroidism Father    Cancer Father        prostate   Breast cancer Mother 42    Review of Systems  Constitutional: Negative for chills and fever.       Hot flashes  Respiratory: Negative for cough, shortness of breath and wheezing.   Cardiovascular: Negative for chest pain, palpitations and leg swelling.  Neurological: Positive for dizziness (occ) and headaches (from wisdom teeth). Negative for light-headedness.       Objective:   Vitals:   10/18/19 0932  BP: (!) 154/96  Pulse: (!) 58  Resp: 16  Temp: 98.2 F (36.8 C)  SpO2: 99%   BP Readings from Last 3 Encounters:  10/18/19 (!) 154/96  07/18/19 (!) 164/100  04/12/19 (!) 154/96   Wt Readings from Last 3 Encounters:  10/18/19 253 lb (114.8 kg)  07/18/19 243 lb (110.2 kg)  04/12/19 246 lb (111.6 kg)   Body mass index is 41.46 kg/m.   Physical Exam    Constitutional: Appears well-developed and well-nourished. No distress.  HENT:  Head: Normocephalic and atraumatic.  Neck: Neck supple. No tracheal deviation present. No thyromegaly present.  No cervical lymphadenopathy Cardiovascular: Normal rate, regular rhythm and normal heart sounds.   1/6 systolic murmur heard. No carotid bruit .  No edema Pulmonary/Chest: Effort normal and breath sounds normal. No respiratory distress. No has no wheezes. No rales.  Skin: Skin is warm and dry. Not diaphoretic.  Psychiatric: Normal mood and affect. Behavior is normal.      Assessment & Plan:    See Problem List for Assessment and Plan of chronic medical problems.  Follow-up in 3 months   This visit occurred during the SARS-CoV-2 public health emergency.  Safety protocols were in place, including screening questions prior to the visit, additional usage of staff PPE, and extensive cleaning of exam room while observing appropriate contact time as indicated for disinfecting solutions.

## 2019-10-17 NOTE — Patient Instructions (Addendum)
  Tests ordered today. Your results will be released to [facility] (or called to you) after review.  If any changes need to be made, you will be notified at that same time.    Medications reviewed and updated.  Changes include :   None  Your prescription(s) have been submitted to your pharmacy. Please take as directed and contact our office if you believe you are having problem(s) with the medication(s).    Please followup in 3 months

## 2019-10-18 ENCOUNTER — Encounter: Payer: Self-pay | Admitting: Internal Medicine

## 2019-10-18 ENCOUNTER — Other Ambulatory Visit: Payer: Self-pay

## 2019-10-18 ENCOUNTER — Ambulatory Visit (INDEPENDENT_AMBULATORY_CARE_PROVIDER_SITE_OTHER): Payer: BC Managed Care – PPO | Admitting: Internal Medicine

## 2019-10-18 ENCOUNTER — Other Ambulatory Visit (INDEPENDENT_AMBULATORY_CARE_PROVIDER_SITE_OTHER): Payer: BC Managed Care – PPO

## 2019-10-18 VITALS — BP 154/96 | HR 58 | Temp 98.2°F | Resp 16 | Ht 65.5 in | Wt 253.0 lb

## 2019-10-18 DIAGNOSIS — E785 Hyperlipidemia, unspecified: Secondary | ICD-10-CM | POA: Insufficient documentation

## 2019-10-18 DIAGNOSIS — E782 Mixed hyperlipidemia: Secondary | ICD-10-CM

## 2019-10-18 DIAGNOSIS — E032 Hypothyroidism due to medicaments and other exogenous substances: Secondary | ICD-10-CM | POA: Diagnosis not present

## 2019-10-18 DIAGNOSIS — D508 Other iron deficiency anemias: Secondary | ICD-10-CM | POA: Diagnosis not present

## 2019-10-18 DIAGNOSIS — I10 Essential (primary) hypertension: Secondary | ICD-10-CM

## 2019-10-18 LAB — CBC WITH DIFFERENTIAL/PLATELET
Basophils Absolute: 0 10*3/uL (ref 0.0–0.1)
Basophils Relative: 0.9 % (ref 0.0–3.0)
Eosinophils Absolute: 0.1 10*3/uL (ref 0.0–0.7)
Eosinophils Relative: 3.6 % (ref 0.0–5.0)
HCT: 40.7 % (ref 36.0–46.0)
Hemoglobin: 14 g/dL (ref 12.0–15.0)
Lymphocytes Relative: 26.1 % (ref 12.0–46.0)
Lymphs Abs: 1 10*3/uL (ref 0.7–4.0)
MCHC: 34.3 g/dL (ref 30.0–36.0)
MCV: 94.6 fl (ref 78.0–100.0)
Monocytes Absolute: 0.3 10*3/uL (ref 0.1–1.0)
Monocytes Relative: 7.4 % (ref 3.0–12.0)
Neutro Abs: 2.5 10*3/uL (ref 1.4–7.7)
Neutrophils Relative %: 62 % (ref 43.0–77.0)
Platelets: 290 10*3/uL (ref 150.0–400.0)
RBC: 4.31 Mil/uL (ref 3.87–5.11)
RDW: 14.2 % (ref 11.5–15.5)
WBC: 4 10*3/uL (ref 4.0–10.5)

## 2019-10-18 LAB — COMPREHENSIVE METABOLIC PANEL
ALT: 15 U/L (ref 0–35)
AST: 18 U/L (ref 0–37)
Albumin: 4.7 g/dL (ref 3.5–5.2)
Alkaline Phosphatase: 69 U/L (ref 39–117)
BUN: 16 mg/dL (ref 6–23)
CO2: 33 mEq/L — ABNORMAL HIGH (ref 19–32)
Calcium: 10.3 mg/dL (ref 8.4–10.5)
Chloride: 100 mEq/L (ref 96–112)
Creatinine, Ser: 1 mg/dL (ref 0.40–1.20)
GFR: 57.75 mL/min — ABNORMAL LOW (ref >60.00)
Glucose, Bld: 90 mg/dL (ref 70–99)
Potassium: 3.2 mEq/L — ABNORMAL LOW (ref 3.5–5.1)
Sodium: 140 mEq/L (ref 135–145)
Total Bilirubin: 0.5 mg/dL (ref 0.2–1.2)
Total Protein: 7.5 g/dL (ref 6.0–8.3)

## 2019-10-18 LAB — LIPID PANEL
Cholesterol: 234 mg/dL — ABNORMAL HIGH (ref 0–200)
HDL: 52.3 mg/dL (ref >39.00)
NonHDL: 181.64
Total CHOL/HDL Ratio: 4
Triglycerides: 207 mg/dL — ABNORMAL HIGH (ref 0.0–149.0)
VLDL: 41.4 mg/dL — ABNORMAL HIGH (ref 0.0–40.0)

## 2019-10-18 LAB — LDL CHOLESTEROL, DIRECT: Direct LDL: 150 mg/dL

## 2019-10-18 MED ORDER — HYDROCHLOROTHIAZIDE 25 MG PO TABS: 25.0000 mg | ORAL_TABLET | Freq: Every day | ORAL | 1 refills | Status: DC

## 2019-10-18 MED ORDER — AMLODIPINE BESYLATE 5 MG PO TABS: 5.0000 mg | ORAL_TABLET | Freq: Every day | ORAL | 1 refills | Status: DC

## 2019-10-18 NOTE — Assessment & Plan Note (Signed)
Management per endocrine 

## 2019-10-18 NOTE — Assessment & Plan Note (Signed)
Blood pressure not ideally controlled At her last visit we increased amlodipine to 2 pills a day, but she has only been taking 1.  She will increase this to 2 pills a day today.  Advised that if she experiences edema she should let me know and we will decrease this to once a day and start a new medication Stressed the importance of weight loss, regular exercise, low-sodium diet CMP Follow-up in 3 months, sooner if blood pressure not controlled

## 2019-10-18 NOTE — Assessment & Plan Note (Signed)
We will check CBC Not currently having periods-likely menopausal

## 2019-10-18 NOTE — Assessment & Plan Note (Signed)
Cholesterol has been elevated Discussed with her that she needs to make lifestyle changes to avoid medication Stressed low fat/cholesterol diet Encourage increasing exercise and working on weight loss Will calculate ASCVD risk when blood work returns

## 2019-10-19 ENCOUNTER — Encounter: Payer: Self-pay | Admitting: Internal Medicine

## 2019-10-19 ENCOUNTER — Other Ambulatory Visit: Payer: Self-pay | Admitting: Internal Medicine

## 2019-10-19 MED ORDER — POTASSIUM CHLORIDE CRYS ER 20 MEQ PO TBCR: 20.0000 meq | EXTENDED_RELEASE_TABLET | Freq: Every day | ORAL | 1 refills | Status: DC

## 2019-12-25 ENCOUNTER — Other Ambulatory Visit: Payer: Self-pay | Admitting: Internal Medicine

## 2019-12-25 DIAGNOSIS — Z1231 Encounter for screening mammogram for malignant neoplasm of breast: Secondary | ICD-10-CM

## 2019-12-30 ENCOUNTER — Other Ambulatory Visit: Payer: Self-pay | Admitting: Internal Medicine

## 2019-12-30 DIAGNOSIS — E032 Hypothyroidism due to medicaments and other exogenous substances: Secondary | ICD-10-CM

## 2020-01-01 NOTE — Telephone Encounter (Signed)
Not necessarily, but please check with her to see if we can send the generic.  She has an appointment in 2 weeks so you may want to send just 2 weeks worth of levothyroxine for now.

## 2020-01-01 NOTE — Telephone Encounter (Signed)
Brand name not covered, does she need to have brand name?

## 2020-01-12 ENCOUNTER — Other Ambulatory Visit: Payer: Self-pay

## 2020-01-12 ENCOUNTER — Ambulatory Visit: Payer: BC Managed Care – PPO | Admitting: Internal Medicine

## 2020-01-12 ENCOUNTER — Encounter: Payer: Self-pay | Admitting: Internal Medicine

## 2020-01-12 VITALS — BP 120/78 | HR 60 | Ht 65.5 in | Wt 252.0 lb

## 2020-01-12 DIAGNOSIS — C73 Malignant neoplasm of thyroid gland: Secondary | ICD-10-CM

## 2020-01-12 DIAGNOSIS — E890 Postprocedural hypothyroidism: Secondary | ICD-10-CM

## 2020-01-12 DIAGNOSIS — E032 Hypothyroidism due to medicaments and other exogenous substances: Secondary | ICD-10-CM | POA: Diagnosis not present

## 2020-01-12 LAB — T4, FREE: Free T4: 0.98 ng/dL (ref 0.60–1.60)

## 2020-01-12 LAB — TSH: TSH: 2.79 u[IU]/mL (ref 0.35–4.50)

## 2020-01-12 NOTE — Progress Notes (Signed)
Patient ID: Shelly Baldwin, female   DOB: 08/09/1966, 54 y.o.   MRN: [id number]  This visit occurred during the SARS-CoV-2 public health emergency.  Safety protocols were in place, including screening questions prior to the visit, additional usage of staff PPE, and extensive cleaning of exam room while observing appropriate contact time as indicated for disinfecting solutions.   HPI  Shelly Baldwin is a 54 y.o.-year-old female, returning for f/u for thyroid cancer and postsurgical hypothyroidism. Last visit 1 year ago.  She works from home during the coronavirus pandemic.  Reviewed history: Pt. has been found to have a goiter with neck compression symptoms sxs, then had Bx >> inconclusive >> total thyroidectomy >> dx with thyroid cancer in 2004, had total thyroidectomy in 2005.  She remembers having had RAI treatment and a whole-body scan posttreatment. She had rTSH - stim WBS every year for 5 years >> no mets (up to 2010)  She is on Synthroid d.a.w. 100 mcg daily (dose changed 12/2018): - in am - fasting - at least 30 min from b'fast - no Ca, MVI, PPIs - + Fe >4h after LT4 - not on Biotin Also, on potassium, vit D, Zinc.  Latest TSH was low, but she did not return for labs after decreasing the dose of Synthroid, due to the coronavirus pandemic: Lab Results  Component Value Date   TSH 0.15 (L) 01/11/2019   TSH 0.65 03/14/2018   TSH 0.30 (L) 01/11/2018   TSH 1.65 03/24/2017   TSH 0.17 (L) 01/11/2017   TSH 2.26 11/29/2015   TSH 0.42 11/26/2014   TSH 0.011 (L) 09/26/2014   TSH 0.01 (L) 03/22/2014   TSH 4.06 11/24/2013   FREET4 1.22 01/11/2019   FREET4 0.96 03/14/2018   FREET4 0.96 01/11/2018   FREET4 1.04 01/11/2017   FREET4 0.86 11/29/2015   FREET4 0.95 11/26/2014   FREET4 1.59 09/26/2014   FREET4 1.50 03/22/2014   FREET4 0.70 11/24/2013   FREET4 0.79 08/14/2011    Her thyroglobulin levels remain undetectable: Lab Results  Component Value Date   THYROGLB <0.1 (L) 01/11/2019    THYROGLB <0.1 (L) 01/11/2018   THYROGLB <0.1 (L) 01/11/2017   THYROGLB <2.0 11/29/2015   THYROGLB <2.0 09/26/2014   THYROGLB <0.2 11/24/2013   THYROGLB <0.2 08/14/2011   She had 1 instance of elevated ATA antibodies: Lab Results  Component Value Date   THGAB <1.0 01/11/2019   THGAB <1.0 01/11/2018   THGAB <1.0 01/11/2017   THGAB <1.0 11/29/2015   THGAB <1.0 09/26/2014   THGAB 485.0 (H) 11/24/2013   THGAB <20.0 08/14/2011  Need to send Tg + ATA to [facility].  Pt denies: - feeling nodules in neck - hoarseness - dysphagia - choking - SOB with lying down  She also has HTN.  She has BPPV - episodic.  ROS: Constitutional: no weight gain/no weight loss, no fatigue, no subjective hyperthermia, no subjective hypothermia Eyes: no blurry vision, no xerophthalmia ENT: no sore throat, + see HPI Cardiovascular: no CP/no SOB/no palpitations/no leg swelling Respiratory: no cough/no SOB/no wheezing Gastrointestinal: no N/no V/no D/no C/no acid reflux Musculoskeletal: no muscle aches/no joint aches Skin: no rashes, no hair loss Neurological: no tremors/no numbness/no tingling/no dizziness  I reviewed pt's medications, allergies, PMH, social hx, family hx, and changes were documented in the history of present illness. Otherwise, unchanged from my initial visit note.  Past Medical History:  Diagnosis Date  . Cancer (HCC)    undifferentiated thryoid  . Thyroid disease    Past  Surgical History:  Procedure Laterality Date  . G0P0    . THYROIDECTOMY     2005   History   Social History  . Marital Status: Single    Spouse Name: N/A    Number of Children: 0  . Years of Education: 14   Occupational History  . technical support    Social History Main Topics  . Smoking status: Former Smoker    Quit date: 08/13/1986  . Smokeless tobacco: Never Used  . Alcohol Use: No  . Drug Use: No   Social History Narrative   HSG, [employer] in [place], matriculating [employer] (Sept  '12). Single. Work - Loss adjuster, chartered at [employer]. \Do you exercise at least 3 times a week- twice a week with walking. Is there a smoke alarm in your house- yes. Are there guns and firearms in your household- no . Have you experienced physical abuse-no         Usual # of hours of sleep per night 6-7 hours per night   Number of people living at your residence- Pt lives alone   Current Outpatient Medications on File Prior to Visit  Medication Sig Dispense Refill  . amLODipine (NORVASC) 5 MG tablet Take 1 tablet (5 mg total) by mouth daily. 180 tablet 1  . fluticasone (FLONASE) 50 MCG/ACT nasal spray Place into both nostrils daily.    . hydrochlorothiazide (HYDRODIURIL) 25 MG tablet Take 1 tablet (25 mg total) by mouth daily. 90 tablet 1  . Loratadine (CLARITIN PO) Take by mouth daily.    . potassium chloride SA (KLOR-CON) 20 MEQ tablet Take 1 tablet (20 mEq total) by mouth daily. 90 tablet 1  . SYNTHROID 100 MCG tablet TAKE 1 TABLET (100 MCG TOTAL) BY MOUTH DAILY BEFORE BREAKFAST. 45 tablet 3   No current facility-administered medications on file prior to visit.   No Known Allergies Family History  Problem Relation Age of Onset  . Hypertension Father   . Diabetes Father   . Hypothyroidism Father   . Cancer Father        prostate  . Breast cancer Mother 73   PE: BP 120/78   Pulse 60   Ht 5' 5.5" (1.664 m)   Wt 252 lb (114.3 kg)   SpO2 99%   BMI 41.30 kg/m  There is no height or weight on file to calculate BMI. Wt Readings from Last 3 Encounters:  01/12/20 252 lb (114.3 kg)  10/18/19 253 lb (114.8 kg)  07/18/19 243 lb (110.2 kg)   Constitutional: overweight, in NAD Eyes: PERRLA, EOMI, no exophthalmos ENT: moist mucous membranes, no neck masses palpated, thyroidectomy scar healed with minimal keloid, no cervical lymphadenopathy Cardiovascular: RRR, No MRG Respiratory: CTA B Gastrointestinal: abdomen soft, NT, ND, BS+ Musculoskeletal: no deformities, strength intact in all  4 Skin: moist, warm, no rashes Neurological: no tremor with outstretched hands, DTR normal in all 4  ASSESSMENT: 1. Postsurgical Hypothyroidism - on Brand name Synthroid  2. Thyroid Cancer (stage 1 or 2) in remission  PLAN:  1. Patient with longstanding, postsurgical hypothyroidism after total thyroidectomy for thyroid cancer - latest thyroid labs reviewed with pt >> suppressed, after which we decreased her Synthroid dose: Lab Results  Component Value Date   TSH 0.15 (L) 01/11/2019   - she continues on Synthroid d.a.w. 100 mcg daily - pt feels good on this dose, without complaints. - we discussed about taking the thyroid hormone every day, with water, >30 minutes before breakfast, separated by >4  hours from acid reflux medications, calcium, iron, multivitamins. Pt. is taking it correctly. - will check thyroid tests today: TSH and fT4 as she did not return for repeat labs after the last dose change 2/2 coronavs pandemic. - If labs are abnormal, she will need to return for repeat TFTs in 1.5 months  2. PTC -Reviewed history: Whole body scans were negative up to 2010.  She continues to have undetectable thyroglobulin.  She had one instance of elevated ATA antibodies, but ATA normalized in the last 5 years. -At today's visit we will repeat her thyroglobulin ([facility] assay) and ATA -I ordered a neck ultrasound at last visit, however she did not have this done... -for now, we can hold off a neck ultrasound but order this if the thyroglobulin increases. -No neck compression symptoms  Needs refills - 90 days  Component     Latest Ref Rng & Units 01/12/2020  Thyroglobulin Antibody     0.0 - 0.9 IU/mL <1.0  TSH     0.35 - 4.50 uIU/mL 2.79  T4,Free(Direct)     0.60 - 1.60 ng/dL 0.98  Thyroglobulin by IMA     1.5 - 38.5 ng/mL <0.1 (L)  Normal labs.  Philemon Kingdom, MD PhD [facility]

## 2020-01-12 NOTE — Patient Instructions (Signed)
Please continue Synthroid 100 mcg daily. ? ?Take the thyroid hormone every day, with water, at least 30 minutes before breakfast, separated by at least 4 hours from: ?- acid reflux medications ?- calcium ?- iron ?- multivitamins ? ?Please stop at the lab. ? ?Please come back for a follow-up appointment in 1 year. ? ? ?

## 2020-01-14 LAB — THYROGLOBULIN BY IMA: Thyroglobulin by IMA: <0.1 ng/mL — ABNORMAL LOW (ref 1.5–38.5)

## 2020-01-14 LAB — TGAB+THYROGLOBULIN IMA OR LCMS: Thyroglobulin Antibody: <1 IU/mL (ref 0.0–0.9)

## 2020-01-14 MED ORDER — SYNTHROID 100 MCG PO TABS: 100.0000 ug | ORAL_TABLET | Freq: Every day | ORAL | 3 refills | Status: DC

## 2020-01-15 NOTE — Progress Notes (Signed)
Subjective:    Patient ID: Shelly Baldwin, female    DOB: [birth year], 54 y.o.   MRN: [id number]  HPI The patient is here for follow up of their chronic medical problems, including hypertension, anemia d/t menses, obesity, hyperlipidemia.  She is taking all of her medications as prescribed.   She is exercising regularly - going to gym once a week.  She just bought a treadmill and rowing machine.  She is also doing videos.  She works out almost daily.  She joined [employer] last month and is working on weight loss.     BP at home around 137/95.    Medications and allergies reviewed with patient and updated if appropriate.  Patient Active Problem List   Diagnosis Date Noted  . Hyperlipidemia 10/18/2019  . BPPV (benign paroxysmal positional vertigo), right 07/18/2019  . Anemia 08/31/2018  . Menorrhagia with irregular cycle 03/24/2017  . Hypertension 09/17/2015  . Morbid obesity ([place]) 12/15/2013  . Hypothyroidism, iatrogenic 08/04/2012  . Thyroid cancer [facility])     Current Outpatient Medications on File Prior to Visit  Medication Sig Dispense Refill  . fluticasone (FLONASE) 50 MCG/ACT nasal spray Place into both nostrils daily.    . Loratadine (CLARITIN PO) Take by mouth daily.    Marland Kitchen SYNTHROID 100 MCG tablet Take 1 tablet (100 mcg total) by mouth daily before breakfast. 90 tablet 3   No current facility-administered medications on file prior to visit.    Past Medical History:  Diagnosis Date  . Cancer (HCC)    undifferentiated thryoid  . Thyroid disease     Past Surgical History:  Procedure Laterality Date  . G0P0    . THYROIDECTOMY     2005    Social History   Socioeconomic History  . Marital status: Single    Spouse name: Not on file  . Number of children: 0  . Years of education: 51  . Highest education level: Not on file  Occupational History  . Occupation: Hotel manager support  Tobacco Use  . Smoking status: Former Smoker    Quit date: 08/13/1986   Years since quitting: 33.4  . Smokeless tobacco: Never Used  Substance and Sexual Activity  . Alcohol use: No  . Drug use: No  . Sexual activity: Not on file  Other Topics Concern  . Not on file  Social History Narrative   HSG, [employer] in [place], matriculating [employer] (Sept '12). Single. Work - Loss adjuster, chartered at [employer]. \Do you exercise at least 3 times a week- twice a week with walking. Is there a smoke alarm in your house- yes. Are there guns and firearms in your household- no . Have you experienced physical abuse-no         Usual # of hours of sleep per night 6-7 hours per night   Number of people living at your residence- Pt lives alone                  Social Determinants of Health   Financial Resource Strain:   . Difficulty of Paying Living Expenses: Not on file  Food Insecurity:   . Worried About Charity fundraiser in the Last Year: Not on file  . Ran Out of Food in the Last Year: Not on file  Transportation Needs:   . Lack of Transportation (Medical): Not on file  . Lack of Transportation (Non-Medical): Not on file  Physical Activity:   . Days of Exercise per Week: Not on  file  . Minutes of Exercise per Session: Not on file  Stress:   . Feeling of Stress : Not on file  Social Connections:   . Frequency of Communication with Friends and Family: Not on file  . Frequency of Social Gatherings with Friends and Family: Not on file  . Attends Religious Services: Not on file  . Active Member of Clubs or Organizations: Not on file  . Attends Archivist Meetings: Not on file  . Marital Status: Not on file    Family History  Problem Relation Age of Onset  . Hypertension Father   . Diabetes Father   . Hypothyroidism Father   . Cancer Father        prostate  . Breast cancer Mother 57    Review of Systems  Constitutional: Negative for chills and fever.  Respiratory: Negative for cough, shortness of breath and wheezing.   Cardiovascular:  Negative for chest pain and palpitations.  Neurological: Negative for dizziness, light-headedness and headaches.       Objective:   Vitals:   01/16/20 0819 01/16/20 0904  BP: (!) 164/92 136/84  Pulse: (!) 59   Resp: 18   Temp: 97.9 F (36.6 C)   SpO2: 99%    BP Readings from Last 3 Encounters:  01/16/20 136/84  01/12/20 120/78  10/18/19 (!) 154/96   Wt Readings from Last 3 Encounters:  01/16/20 253 lb (114.8 kg)  01/12/20 252 lb (114.3 kg)  10/18/19 253 lb (114.8 kg)   Body mass index is 41.46 kg/m.   Physical Exam    Constitutional: Appears well-developed and well-nourished. No distress.  HENT:  Head: Normocephalic and atraumatic.  Neck: Neck supple. No tracheal deviation present. No thyromegaly present.  No cervical lymphadenopathy Cardiovascular: Normal rate, regular rhythm and normal heart sounds.   No murmur heard. No carotid bruit .  No edema Pulmonary/Chest: Effort normal and breath sounds normal. No respiratory distress. No has no wheezes. No rales.  Skin: Skin is warm and dry. Not diaphoretic.  Psychiatric: Normal mood and affect. Behavior is normal.   The 10-year ASCVD risk score Mikey Bussing DC Jr., et al., 2013) is: 6.9%   Values used to calculate the score:     Age: 26 years     Sex: Female     Is Non-Hispanic African American: Yes     Diabetic: No     Tobacco smoker: No     Systolic Blood Pressure: [id number] mmHg     Is BP treated: Yes     HDL Cholesterol: 52.3 mg/dL     Total Cholesterol: 234 mg/dL    Assessment & Plan:    See Problem List for Assessment and Plan of chronic medical problems.    This visit occurred during the SARS-CoV-2 public health emergency.  Safety protocols were in place, including screening questions prior to the visit, additional usage of staff PPE, and extensive cleaning of exam room while observing appropriate contact time as indicated for disinfecting solutions.

## 2020-01-15 NOTE — Patient Instructions (Addendum)
  Blood work was ordered.     Medications reviewed and updated.  Changes include :   None   Your prescription(s) have been submitted to your pharmacy. Please take as directed and contact our office if you believe you are having problem(s) with the medication(s).     Please followup in 6 months   

## 2020-01-16 ENCOUNTER — Ambulatory Visit: Payer: BC Managed Care – PPO | Admitting: Internal Medicine

## 2020-01-16 ENCOUNTER — Other Ambulatory Visit: Payer: Self-pay

## 2020-01-16 ENCOUNTER — Encounter: Payer: Self-pay | Admitting: Internal Medicine

## 2020-01-16 VITALS — BP 136/84 | HR 59 | Temp 97.9°F | Resp 18 | Ht 65.5 in | Wt 253.0 lb

## 2020-01-16 DIAGNOSIS — E782 Mixed hyperlipidemia: Secondary | ICD-10-CM | POA: Diagnosis not present

## 2020-01-16 DIAGNOSIS — I10 Essential (primary) hypertension: Secondary | ICD-10-CM | POA: Diagnosis not present

## 2020-01-16 DIAGNOSIS — D508 Other iron deficiency anemias: Secondary | ICD-10-CM

## 2020-01-16 LAB — CBC WITH DIFFERENTIAL/PLATELET
Basophils Absolute: 0 10*3/uL (ref 0.0–0.1)
Basophils Relative: 0.8 % (ref 0.0–3.0)
Eosinophils Absolute: 0.1 10*3/uL (ref 0.0–0.7)
Eosinophils Relative: 2.6 % (ref 0.0–5.0)
HCT: 38.8 % (ref 36.0–46.0)
Hemoglobin: 13.6 g/dL (ref 12.0–15.0)
Lymphocytes Relative: 23.6 % (ref 12.0–46.0)
Lymphs Abs: 1.1 10*3/uL (ref 0.7–4.0)
MCHC: 34.9 g/dL (ref 30.0–36.0)
MCV: 94.7 fl (ref 78.0–100.0)
Monocytes Absolute: 0.5 10*3/uL (ref 0.1–1.0)
Monocytes Relative: 10.2 % (ref 3.0–12.0)
Neutro Abs: 2.9 10*3/uL (ref 1.4–7.7)
Neutrophils Relative %: 62.8 % (ref 43.0–77.0)
Platelets: 264 10*3/uL (ref 150.0–400.0)
RBC: 4.1 Mil/uL (ref 3.87–5.11)
RDW: 14.3 % (ref 11.5–15.5)
WBC: 4.6 10*3/uL (ref 4.0–10.5)

## 2020-01-16 LAB — COMPREHENSIVE METABOLIC PANEL
ALT: 14 U/L (ref 0–35)
AST: 18 U/L (ref 0–37)
Albumin: 4.5 g/dL (ref 3.5–5.2)
Alkaline Phosphatase: 75 U/L (ref 39–117)
BUN: 12 mg/dL (ref 6–23)
CO2: 32 mEq/L (ref 19–32)
Calcium: 10.8 mg/dL — ABNORMAL HIGH (ref 8.4–10.5)
Chloride: 97 mEq/L (ref 96–112)
Creatinine, Ser: 1.11 mg/dL (ref 0.40–1.20)
GFR: 61.89 mL/min (ref >60.00)
Glucose, Bld: 98 mg/dL (ref 70–99)
Potassium: 3.5 mEq/L (ref 3.5–5.1)
Sodium: 137 mEq/L (ref 135–145)
Total Bilirubin: 0.5 mg/dL (ref 0.2–1.2)
Total Protein: 7.6 g/dL (ref 6.0–8.3)

## 2020-01-16 LAB — LIPID PANEL
Cholesterol: 222 mg/dL — ABNORMAL HIGH (ref 0–200)
HDL: 49.9 mg/dL (ref >39.00)
LDL Cholesterol: 134 mg/dL — ABNORMAL HIGH (ref 0–99)
NonHDL: 171.91
Total CHOL/HDL Ratio: 4
Triglycerides: 191 mg/dL — ABNORMAL HIGH (ref 0.0–149.0)
VLDL: 38.2 mg/dL (ref 0.0–40.0)

## 2020-01-16 MED ORDER — AMLODIPINE BESYLATE 5 MG PO TABS: 5.0000 mg | ORAL_TABLET | Freq: Every day | ORAL | 1 refills | Status: DC

## 2020-01-16 MED ORDER — POTASSIUM CHLORIDE CRYS ER 20 MEQ PO TBCR: 20.0000 meq | EXTENDED_RELEASE_TABLET | Freq: Every day | ORAL | 1 refills | Status: DC

## 2020-01-16 MED ORDER — HYDROCHLOROTHIAZIDE 25 MG PO TABS: 25.0000 mg | ORAL_TABLET | Freq: Every day | ORAL | 1 refills | Status: DC

## 2020-01-16 NOTE — Assessment & Plan Note (Signed)
Chronic Currently diet controlled Check lipids  The 10-year ASCVD risk score Mikey Bussing DC Jr., et al., 2013) is: 6.9%   Values used to calculate the score:     Age: 54 years     Sex: Female     Is Non-Hispanic African American: Yes     Diabetic: No     Tobacco smoker: No     Systolic Blood Pressure: [id number] mmHg     Is BP treated: Yes     HDL Cholesterol: 52.3 mg/dL     Total Cholesterol: 234 mg/dL   Work on weight loss, continue exercise

## 2020-01-16 NOTE — Assessment & Plan Note (Signed)
Chronic BP borderline high Current  well tolerated Continue current medications at current doses Working on weight loss Continue regular exercise and healthy diet cmp

## 2020-01-16 NOTE — Assessment & Plan Note (Signed)
Check CBC menopausal

## 2020-01-16 NOTE — Assessment & Plan Note (Addendum)
Chronic She is working on [employer] loss-has joined [employer] She is exercising regularly She will continue her weight loss efforts She understands the benefits of weight loss Follow-up in 6 months

## 2020-01-18 ENCOUNTER — Encounter: Payer: Self-pay | Admitting: Internal Medicine

## 2020-02-02 ENCOUNTER — Encounter: Payer: Self-pay | Admitting: Internal Medicine

## 2020-02-02 ENCOUNTER — Other Ambulatory Visit: Payer: Self-pay

## 2020-02-02 ENCOUNTER — Ambulatory Visit: Payer: BC Managed Care – PPO

## 2020-02-02 ENCOUNTER — Ambulatory Visit
Admission: RE | Admit: 2020-02-02 | Discharge: 2020-02-02 | Disposition: A | Discharge status: Home / Self Care | Payer: BC Managed Care – PPO | Source: Ambulatory Visit | Attending: Internal Medicine | Admitting: Internal Medicine

## 2020-02-02 DIAGNOSIS — Z1231 Encounter for screening mammogram for malignant neoplasm of breast: Secondary | ICD-10-CM | POA: Diagnosis not present

## 2020-06-07 ENCOUNTER — Other Ambulatory Visit: Payer: Self-pay | Admitting: Internal Medicine

## 2020-06-07 DIAGNOSIS — E890 Postprocedural hypothyroidism: Secondary | ICD-10-CM

## 2020-06-07 NOTE — Telephone Encounter (Signed)
Brand name no longer covered. Please advise  All Pharmacy Suggested Alternatives:   levothyroxine (SYNTHROID) 25 MCG tablet levothyroxine (SYNTHROID) 100 MCG tablet levothyroxine (UNITHROID) 25 MCG tablet

## 2020-06-07 NOTE — Telephone Encounter (Signed)
In my opinion, it is ok to switch to the same dose of generic levothyroxine.  Please check with patient if she also agrees.

## 2020-06-18 ENCOUNTER — Other Ambulatory Visit: Payer: Self-pay | Admitting: Internal Medicine

## 2020-06-18 ENCOUNTER — Other Ambulatory Visit: Payer: Self-pay

## 2020-06-18 DIAGNOSIS — E032 Hypothyroidism due to medicaments and other exogenous substances: Secondary | ICD-10-CM

## 2020-07-21 DIAGNOSIS — E876 Hypokalemia: Secondary | ICD-10-CM | POA: Insufficient documentation

## 2020-07-21 NOTE — Patient Instructions (Signed)
  Blood work was ordered.     Medications reviewed and updated.  Changes include :     Your prescription(s) have been submitted to your pharmacy. Please take as directed and contact our office if you believe you are having problem(s) with the medication(s).  A referral was ordered for        Someone from their office will call you to schedule an appointment.    Please followup in 6 months   

## 2020-07-21 NOTE — Progress Notes (Signed)
Subjective:    Patient ID: Shelly Baldwin, female    DOB: [birth year], 54 y.o.   MRN: [id number]  HPI The patient is here for follow up of their chronic medical problems, including htn, h/o anemia d/t menses, obesity, hyperlipidemia, hypokalemia  She is exercising regularly.       Medications and allergies reviewed with patient and updated if appropriate.  Patient Active Problem List   Diagnosis Date Noted  . Hyperlipidemia 10/18/2019  . BPPV (benign paroxysmal positional vertigo), right 07/18/2019  . Anemia 08/31/2018  . Menorrhagia with irregular cycle 03/24/2017  . Hypertension 09/17/2015  . Morbid obesity ([place]) 12/15/2013  . Hypothyroidism, iatrogenic 08/04/2012  . Thyroid cancer [facility])     Current Outpatient Medications on File Prior to Visit  Medication Sig Dispense Refill  . amLODipine (NORVASC) 5 MG tablet Take 1 tablet (5 mg total) by mouth daily. 180 tablet 1  . fluticasone (FLONASE) 50 MCG/ACT nasal spray Place into both nostrils daily.    . hydrochlorothiazide (HYDRODIURIL) 25 MG tablet TAKE 1 TABLET BY MOUTH EVERY DAY 90 tablet 1  . Loratadine (CLARITIN PO) Take by mouth daily.    . potassium chloride SA (KLOR-CON) 20 MEQ tablet Take 1 tablet (20 mEq total) by mouth daily. 90 tablet 1  . SYNTHROID 100 MCG tablet TAKE 1 TABLET (100 MCG TOTAL) BY MOUTH DAILY BEFORE BREAKFAST. 90 tablet 1   No current facility-administered medications on file prior to visit.    Past Medical History:  Diagnosis Date  . Cancer (HCC)    undifferentiated thryoid  . Thyroid disease     Past Surgical History:  Procedure Laterality Date  . G0P0    . THYROIDECTOMY     2005    Social History   Socioeconomic History  . Marital status: Single    Spouse name: Not on file  . Number of children: 0  . Years of education: 9  . Highest education level: Not on file  Occupational History  . Occupation: Hotel manager support  Tobacco Use  . Smoking status: Former Smoker    Quit date:  08/13/1986    Years since quitting: 33.9  . Smokeless tobacco: Never Used  Substance and Sexual Activity  . Alcohol use: No  . Drug use: No  . Sexual activity: Not on file  Other Topics Concern  . Not on file  Social History Narrative   HSG, [employer] in [place], matriculating [employer] (Sept '12). Single. Work - Loss adjuster, chartered at [employer]. \Do you exercise at least 3 times a week- twice a week with walking. Is there a smoke alarm in your house- yes. Are there guns and firearms in your household- no . Have you experienced physical abuse-no         Usual # of hours of sleep per night 6-7 hours per night   Number of people living at your residence- Pt lives alone                  Social Determinants of Health   Financial Resource Strain:   . Difficulty of Paying Living Expenses: Not on file  Food Insecurity:   . Worried About Charity fundraiser in the Last Year: Not on file  . Ran Out of Food in the Last Year: Not on file  Transportation Needs:   . Lack of Transportation (Medical): Not on file  . Lack of Transportation (Non-Medical): Not on file  Physical Activity:   . Days of Exercise per  Week: Not on file  . Minutes of Exercise per Session: Not on file  Stress:   . Feeling of Stress : Not on file  Social Connections:   . Frequency of Communication with Friends and Family: Not on file  . Frequency of Social Gatherings with Friends and Family: Not on file  . Attends Religious Services: Not on file  . Active Member of Clubs or Organizations: Not on file  . Attends Archivist Meetings: Not on file  . Marital Status: Not on file    Family History  Problem Relation Age of Onset  . Hypertension Father   . Diabetes Father   . Hypothyroidism Father   . Cancer Father        prostate  . Breast cancer Mother 11    Review of Systems     Objective:  There were no vitals filed for this visit. BP Readings from Last 3 Encounters:  01/16/20 136/84  01/12/20  120/78  10/18/19 (!) 154/96   Wt Readings from Last 3 Encounters:  01/16/20 253 lb (114.8 kg)  01/12/20 252 lb (114.3 kg)  10/18/19 253 lb (114.8 kg)   There is no height or weight on file to calculate BMI.   Physical Exam    Constitutional: Appears well-developed and well-nourished. No distress.  HENT:  Head: Normocephalic and atraumatic.  Neck: Neck supple. No tracheal deviation present. No thyromegaly present.  No cervical lymphadenopathy Cardiovascular: Normal rate, regular rhythm and normal heart sounds.   No murmur heard. No carotid bruit .  No edema Pulmonary/Chest: Effort normal and breath sounds normal. No respiratory distress. No has no wheezes. No rales.  Skin: Skin is warm and dry. Not diaphoretic.  Psychiatric: Normal mood and affect. Behavior is normal.      Assessment & Plan:    See Problem List for Assessment and Plan of chronic medical problems.    This visit occurred during the SARS-CoV-2 public health emergency.  Safety protocols were in place, including screening questions prior to the visit, additional usage of staff PPE, and extensive cleaning of exam room while observing appropriate contact time as indicated for disinfecting solutions.    This encounter was created in error - please disregard.

## 2020-07-23 ENCOUNTER — Encounter: Payer: BC Managed Care – PPO | Admitting: Internal Medicine

## 2020-07-23 DIAGNOSIS — Z0289 Encounter for other administrative examinations: Secondary | ICD-10-CM

## 2020-09-28 ENCOUNTER — Ambulatory Visit (INDEPENDENT_AMBULATORY_CARE_PROVIDER_SITE_OTHER): Payer: BC Managed Care – PPO

## 2020-09-28 DIAGNOSIS — Z23 Encounter for immunization: Secondary | ICD-10-CM

## 2020-10-17 NOTE — Progress Notes (Signed)
Subjective:    Patient ID: Shelly Baldwin, female    DOB: 1966-07-02, 54 y.o.   MRN: [id number]  HPI The patient is here for follow up of their chronic medical problems, including htn, h/o anemia d/t menses, obesity, hyperlipidemia, hypokalemia  She is exercising regularly.   She is doing some walking and a little yoga.   She has increased stress from work and her father has been sick.   Medications and allergies reviewed with patient and updated if appropriate.  Patient Active Problem List   Diagnosis Date Noted  . Hypokalemia 07/21/2020  . Hyperlipidemia 10/18/2019  . BPPV (benign paroxysmal positional vertigo), right 07/18/2019  . Anemia 08/31/2018  . Menorrhagia with irregular cycle 03/24/2017  . Hypertension 09/17/2015  . Morbid obesity ([place]) 12/15/2013  . Hypothyroidism, iatrogenic 08/04/2012  . Thyroid cancer [facility])     Current Outpatient Medications on File Prior to Visit  Medication Sig Dispense Refill  . amLODipine (NORVASC) 5 MG tablet Take 1 tablet (5 mg total) by mouth daily. 180 tablet 1  . fluticasone (FLONASE) 50 MCG/ACT nasal spray Place into both nostrils daily.    . hydrochlorothiazide (HYDRODIURIL) 25 MG tablet TAKE 1 TABLET BY MOUTH EVERY DAY 90 tablet 1  . Loratadine (CLARITIN PO) Take by mouth daily.    . potassium chloride SA (KLOR-CON) 20 MEQ tablet Take 1 tablet (20 mEq total) by mouth daily. 90 tablet 1  . SYNTHROID 100 MCG tablet TAKE 1 TABLET (100 MCG TOTAL) BY MOUTH DAILY BEFORE BREAKFAST. 90 tablet 1   No current facility-administered medications on file prior to visit.    Past Medical History:  Diagnosis Date  . Cancer (HCC)    undifferentiated thryoid  . Thyroid disease     Past Surgical History:  Procedure Laterality Date  . G0P0    . THYROIDECTOMY     2005    Social History   Socioeconomic History  . Marital status: Single    Spouse name: Not on file  . Number of children: 0  . Years of education: 77  . Highest education  level: Not on file  Occupational History  . Occupation: Hotel manager support  Tobacco Use  . Smoking status: Former Smoker    Quit date: 08/13/1986    Years since quitting: 34.2  . Smokeless tobacco: Never Used  Substance and Sexual Activity  . Alcohol use: No  . Drug use: No  . Sexual activity: Not on file  Other Topics Concern  . Not on file  Social History Narrative   HSG, [employer] in [place], matriculating [employer] (Sept '12). Single. Work - Loss adjuster, chartered at [employer]. \Do you exercise at least 3 times a week- twice a week with walking. Is there a smoke alarm in your house- yes. Are there guns and firearms in your household- no . Have you experienced physical abuse-no         Usual # of hours of sleep per night 6-7 hours per night   Number of people living at your residence- Pt lives alone                  Social Determinants of Health   Financial Resource Strain:   . Difficulty of Paying Living Expenses: Not on file  Food Insecurity:   . Worried About Charity fundraiser in the Last Year: Not on file  . Ran Out of Food in the Last Year: Not on file  Transportation Needs:   . Lack  of Transportation (Medical): Not on file  . Lack of Transportation (Non-Medical): Not on file  Physical Activity:   . Days of Exercise per Week: Not on file  . Minutes of Exercise per Session: Not on file  Stress:   . Feeling of Stress : Not on file  Social Connections:   . Frequency of Communication with Friends and Family: Not on file  . Frequency of Social Gatherings with Friends and Family: Not on file  . Attends Religious Services: Not on file  . Active Member of Clubs or Organizations: Not on file  . Attends Archivist Meetings: Not on file  . Marital Status: Not on file    Family History  Problem Relation Age of Onset  . Hypertension Father   . Diabetes Father   . Hypothyroidism Father   . Cancer Father        prostate  . Breast cancer Mother 58    Review  of Systems  Constitutional: Negative for fever.  Respiratory: Negative for cough, shortness of breath and wheezing.   Cardiovascular: Positive for leg swelling (occ LLE). Negative for chest pain and palpitations.  Neurological: Negative for light-headedness and headaches.       Objective:   Vitals:   10/18/20 0908  BP: 132/84  Pulse: (!) 56  Temp: 98 F (36.7 C)  SpO2: 99%   BP Readings from Last 3 Encounters:  10/18/20 132/84  01/16/20 136/84  01/12/20 120/78   Wt Readings from Last 3 Encounters:  10/18/20 249 lb (112.9 kg)  01/16/20 253 lb (114.8 kg)  01/12/20 252 lb (114.3 kg)   Body mass index is 40.81 kg/m.   Physical Exam    Constitutional: Appears well-developed and well-nourished. No distress.  HENT:  Head: Normocephalic and atraumatic.  Neck: Neck supple. No tracheal deviation present. No thyromegaly present.  No cervical lymphadenopathy Cardiovascular: Normal rate, regular rhythm and normal heart sounds.   No murmur heard. No carotid bruit .  No edema Pulmonary/Chest: Effort normal and breath sounds normal. No respiratory distress. No has no wheezes. No rales.  Skin: Skin is warm and dry. Not diaphoretic.  Psychiatric: Normal mood and affect. Behavior is normal.      Assessment & Plan:    See Problem List for Assessment and Plan of chronic medical problems.    This visit occurred during the SARS-CoV-2 public health emergency.  Safety protocols were in place, including screening questions prior to the visit, additional usage of staff PPE, and extensive cleaning of exam room while observing appropriate contact time as indicated for disinfecting solutions.

## 2020-10-17 NOTE — Patient Instructions (Addendum)
    Blood work was ordered.      Medications changes include :   none     Please followup in 6 months  

## 2020-10-18 ENCOUNTER — Encounter: Payer: Self-pay | Admitting: Internal Medicine

## 2020-10-18 ENCOUNTER — Other Ambulatory Visit: Payer: Self-pay

## 2020-10-18 ENCOUNTER — Ambulatory Visit: Payer: BC Managed Care – PPO | Admitting: Internal Medicine

## 2020-10-18 VITALS — BP 132/84 | HR 56 | Temp 98.0°F | Ht 65.5 in | Wt 249.0 lb

## 2020-10-18 DIAGNOSIS — D508 Other iron deficiency anemias: Secondary | ICD-10-CM

## 2020-10-18 DIAGNOSIS — E782 Mixed hyperlipidemia: Secondary | ICD-10-CM

## 2020-10-18 DIAGNOSIS — E876 Hypokalemia: Secondary | ICD-10-CM

## 2020-10-18 DIAGNOSIS — I10 Essential (primary) hypertension: Secondary | ICD-10-CM

## 2020-10-18 NOTE — Assessment & Plan Note (Signed)
Chronic Continue Klor-Con 20 mEq daily cmp

## 2020-10-18 NOTE — Assessment & Plan Note (Signed)
Chronic BP well controlled Continue amlodipine 5 mg daily, hctz 25 mg daily cmp  

## 2020-10-18 NOTE — Assessment & Plan Note (Signed)
Chronic Trying to lose weight and she has lost a few pains Doing some exercise - encouraged her to continue to exercise as much as possible Decrease portions, esp sugars/carbs

## 2020-10-18 NOTE — Assessment & Plan Note (Signed)
Chronic Check lipids Not on medication Low fat/chol diet and exercise Work on weight loss

## 2020-11-23 ENCOUNTER — Ambulatory Visit: Payer: BC Managed Care – PPO

## 2020-12-02 ENCOUNTER — Other Ambulatory Visit: Payer: BC Managed Care – PPO

## 2020-12-09 ENCOUNTER — Other Ambulatory Visit: Payer: Self-pay | Admitting: Internal Medicine

## 2020-12-09 DIAGNOSIS — E032 Hypothyroidism due to medicaments and other exogenous substances: Secondary | ICD-10-CM

## 2020-12-09 NOTE — Telephone Encounter (Signed)
Rx no longer covered. Alternatives are Euthyrox, Levothyroxine, and Lovoxyl.

## 2020-12-10 ENCOUNTER — Other Ambulatory Visit: Payer: Self-pay | Admitting: Internal Medicine

## 2020-12-10 ENCOUNTER — Telehealth: Payer: Self-pay | Admitting: Internal Medicine

## 2020-12-10 DIAGNOSIS — E890 Postprocedural hypothyroidism: Secondary | ICD-10-CM

## 2020-12-10 DIAGNOSIS — I10 Essential (primary) hypertension: Secondary | ICD-10-CM

## 2020-12-10 MED ORDER — LEVOXYL 100 MCG PO TABS: 100.0000 ug | ORAL_TABLET | Freq: Every day | ORAL | 11 refills | Status: DC

## 2020-12-10 NOTE — Telephone Encounter (Signed)
T, we initially got a denial from insurance for Synthroid, telling us that Levoxyl is covered.  Now pharmacy sends me a message back to make sure we need Levoxyl and not Synthroid.  Can you clarify with them?

## 2020-12-10 NOTE — Telephone Encounter (Signed)
I called in Levoxyl instead.

## 2020-12-11 NOTE — Telephone Encounter (Signed)
T, we can send Synthroid DAW if covered, if not, Levoxyl DAW. I really do not care.Marland KitchenMarland Kitchen

## 2020-12-24 ENCOUNTER — Encounter: Payer: Self-pay | Admitting: Internal Medicine

## 2020-12-25 ENCOUNTER — Other Ambulatory Visit: Payer: Self-pay | Admitting: Internal Medicine

## 2020-12-25 NOTE — Telephone Encounter (Signed)
Patient called checking to see if we received a fax from her insurance yet, she said she does not want to take the Levoxyl and her insurance is supposed to be sending something over so she can get the Synthroid. I informed her that our fax machine is being serviced right now and its possible its coming through but we may not have gotten anything yet. Patient understood and was OK with receiving Mychart messages rather than a phone call with updates for this. FYI

## 2020-12-30 MED ORDER — LEVOTHYROXINE SODIUM 100 MCG PO TABS: 100.0000 ug | ORAL_TABLET | Freq: Every day | ORAL | 1 refills | Status: DC

## 2020-12-30 NOTE — Addendum Note (Signed)
Addended by: Lauralyn Primes on: 12/30/2020 11:38 AM   Modules accepted: Orders

## 2020-12-30 NOTE — Telephone Encounter (Signed)
PA completed for Synthroid. Awaiting determination. Will contact pt via MyChart with decision.

## 2021-01-10 ENCOUNTER — Ambulatory Visit: Payer: BC Managed Care – PPO | Admitting: Internal Medicine

## 2021-03-05 ENCOUNTER — Encounter: Payer: Self-pay | Admitting: Internal Medicine

## 2021-03-05 DIAGNOSIS — Z1211 Encounter for screening for malignant neoplasm of colon: Secondary | ICD-10-CM

## 2021-03-13 ENCOUNTER — Other Ambulatory Visit: Payer: Self-pay

## 2021-03-13 ENCOUNTER — Encounter: Payer: Self-pay | Admitting: Internal Medicine

## 2021-03-13 ENCOUNTER — Telehealth: Payer: Self-pay | Admitting: Internal Medicine

## 2021-03-13 ENCOUNTER — Other Ambulatory Visit (INDEPENDENT_AMBULATORY_CARE_PROVIDER_SITE_OTHER): Payer: BC Managed Care – PPO

## 2021-03-13 DIAGNOSIS — I10 Essential (primary) hypertension: Secondary | ICD-10-CM

## 2021-03-13 DIAGNOSIS — E782 Mixed hyperlipidemia: Secondary | ICD-10-CM

## 2021-03-13 LAB — LIPID PANEL
Cholesterol: 199 mg/dL (ref 0–200)
HDL: 48.6 mg/dL (ref >39.00)
NonHDL: 150.07
Total CHOL/HDL Ratio: 4
Triglycerides: 224 mg/dL — ABNORMAL HIGH (ref 0.0–149.0)
VLDL: 44.8 mg/dL — ABNORMAL HIGH (ref 0.0–40.0)

## 2021-03-13 LAB — COMPREHENSIVE METABOLIC PANEL
ALT: 13 U/L (ref 0–35)
AST: 16 U/L (ref 0–37)
Albumin: 4.7 g/dL (ref 3.5–5.2)
Alkaline Phosphatase: 60 U/L (ref 39–117)
BUN: 19 mg/dL (ref 6–23)
CO2: 32 mEq/L (ref 19–32)
Calcium: 10.7 mg/dL — ABNORMAL HIGH (ref 8.4–10.5)
Chloride: 99 mEq/L (ref 96–112)
Creatinine, Ser: 1.16 mg/dL (ref 0.40–1.20)
GFR: 53.06 mL/min — ABNORMAL LOW (ref >60.00)
Glucose, Bld: 81 mg/dL (ref 70–99)
Potassium: 2.9 mEq/L — ABNORMAL LOW (ref 3.5–5.1)
Sodium: 140 mEq/L (ref 135–145)
Total Bilirubin: 0.5 mg/dL (ref 0.2–1.2)
Total Protein: 7.3 g/dL (ref 6.0–8.3)

## 2021-03-13 LAB — LDL CHOLESTEROL, DIRECT: Direct LDL: 131 mg/dL

## 2021-03-13 NOTE — Telephone Encounter (Signed)
Her potassium is low - has she been taking her potassium?   If she has -- She needs to take 2 extra potassium pills today and increase her daily dose to 2 pills daily or 40 mEq daily. [ pls update med list]   Her cholesterol is too high - we need to discuss starting cholesterol medication.  She should make a f/u appt iwht me.  [pls schedule]   Her kidney function is slightly decreased. Her liver tests are normal.   Sugar is normal.

## 2021-03-14 ENCOUNTER — Other Ambulatory Visit: Payer: Self-pay

## 2021-03-14 MED ORDER — POTASSIUM CHLORIDE CRYS ER 20 MEQ PO TBCR: 40.0000 meq | EXTENDED_RELEASE_TABLET | Freq: Every day | ORAL | 0 refills | Status: DC

## 2021-03-19 ENCOUNTER — Encounter: Payer: Self-pay | Admitting: Internal Medicine

## 2021-03-19 DIAGNOSIS — E890 Postprocedural hypothyroidism: Secondary | ICD-10-CM

## 2021-03-20 MED ORDER — SYNTHROID 100 MCG PO TABS: 100.0000 ug | ORAL_TABLET | Freq: Every day | ORAL | 0 refills | Status: DC

## 2021-03-25 ENCOUNTER — Ambulatory Visit: Payer: BC Managed Care – PPO | Admitting: Internal Medicine

## 2021-03-25 ENCOUNTER — Encounter: Payer: Self-pay | Admitting: Internal Medicine

## 2021-03-25 ENCOUNTER — Other Ambulatory Visit: Payer: Self-pay

## 2021-03-25 VITALS — BP 120/88 | HR 64 | Ht 65.5 in | Wt 234.0 lb

## 2021-03-25 DIAGNOSIS — C73 Malignant neoplasm of thyroid gland: Secondary | ICD-10-CM

## 2021-03-25 DIAGNOSIS — E890 Postprocedural hypothyroidism: Secondary | ICD-10-CM

## 2021-03-25 LAB — T4, FREE: Free T4: 1.14 ng/dL (ref 0.60–1.60)

## 2021-03-25 LAB — TSH: TSH: 1.29 u[IU]/mL (ref 0.35–4.50)

## 2021-03-25 NOTE — Progress Notes (Addendum)
Patient ID: Shelly Baldwin, female   DOB: [birth year], 55 y.o.   MRN: [id number]  This visit occurred during the SARS-CoV-2 public health emergency.  Safety protocols were in place, including screening questions prior to the visit, additional usage of staff PPE, and extensive cleaning of exam room while observing appropriate contact time as indicated for disinfecting solutions.   HPI  Shelly Baldwin is a 55 y.o.-year-old female-year-old female, returning for f/u for thyroid cancer and postsurgical hypothyroidism. Last visit 1 year and 2 months ago.  Interim history: She continues to work from home. Per insurance, she had to switch from Synthroid to Levoxyl >> abdominal pain, did not feel well. Now back on Synthroid but for insurance coverage, she needs to obtain them from the mail order pharmacy.  Reviewed history: Pt. has been found to have a goiter with neck compression symptoms sxs, then had Bx >> inconclusive >> total thyroidectomy >> dx with thyroid cancer in 2004, had total thyroidectomy in 2005.  She remembers having had RAI treatment and a whole-body scan posttreatment. She had rTSH - stim WBS every year for 5 years >> no mets (up to 2010)  She is on Synthroid d.a.w. 100 mcg daily (dose changed 12/2018): - in am - fasting - at least 60 min from b'fast - no Ca, MVI, PPIs - + Fe >4h after LT4 - not on Biotin She continues potassium, vit D, Zinc.  Reviewed TFTs: Lab Results  Component Value Date   TSH 2.79 01/12/2020   TSH 0.15 (L) 01/11/2019   TSH 0.65 03/14/2018   TSH 0.30 (L) 01/11/2018   TSH 1.65 03/24/2017   TSH 0.17 (L) 01/11/2017   TSH 2.26 11/29/2015   TSH 0.42 11/26/2014   TSH 0.011 (L) 09/26/2014   TSH 0.01 (L) 03/22/2014   FREET4 0.98 01/12/2020   FREET4 1.22 01/11/2019   FREET4 0.96 03/14/2018   FREET4 0.96 01/11/2018   FREET4 1.04 01/11/2017   FREET4 0.86 11/29/2015   FREET4 0.95 11/26/2014   FREET4 1.59 09/26/2014   FREET4 1.50 03/22/2014   FREET4 0.70 11/24/2013    Her  thyroglobulin levels remain undetectable: Lab Results  Component Value Date   THYROGLB <0.1 (L) 01/12/2020   THYROGLB <0.1 (L) 01/11/2019   THYROGLB <0.1 (L) 01/11/2018   THYROGLB <0.1 (L) 01/11/2017   THYROGLB <2.0 11/29/2015   THYROGLB <2.0 09/26/2014   THYROGLB <0.2 11/24/2013   THYROGLB <0.2 08/14/2011   She had 1 instance of elevated ATA antibodies: Lab Results  Component Value Date   THGAB <1.0 01/12/2020   THGAB <1.0 01/11/2019   THGAB <1.0 01/11/2018   THGAB <1.0 01/11/2017   THGAB <1.0 11/29/2015   THGAB <1.0 09/26/2014   THGAB 485.0 (H) 11/24/2013   THGAB <20.0 08/14/2011  Need to send Tg + ATA to [facility].  Pt denies: - feeling nodules in neck - hoarseness - dysphagia - choking - SOB with lying down  She also has HTN.  She had BPPV - episodic. Not since last OV.  ROS: Constitutional: no weight gain/no weight loss, no fatigue, no subjective hyperthermia, no subjective hypothermia Eyes: no blurry vision, no xerophthalmia ENT: no sore throat, + see HPI Cardiovascular: no CP/no SOB/no palpitations/no leg swelling Respiratory: no cough/no SOB/no wheezing Gastrointestinal: no N/no V/no D/no C/no acid reflux Musculoskeletal: no muscle aches/no joint aches Skin: no rashes, no hair loss Neurological: no tremors/no numbness/no tingling/no dizziness  I reviewed pt's medications, allergies, PMH, social hx, family hx, and changes were documented in the history of  present illness. Otherwise, unchanged from my initial visit note.  Past Medical History:  Diagnosis Date  . Cancer (HCC)    undifferentiated thryoid  . Thyroid disease    Past Surgical History:  Procedure Laterality Date  . G0P0    . THYROIDECTOMY     2005   History   Social History  . Marital Status: Single    Spouse Name: N/A    Number of Children: 0  . Years of Education: 14   Occupational History  . technical support    Social History Main Topics  . Smoking status: Former Smoker     Quit date: 08/13/1986  . Smokeless tobacco: Never Used  . Alcohol Use: No  . Drug Use: No   Social History Narrative   HSG, [employer] in [place], matriculating [employer] (Sept '12). Single. Work - Loss adjuster, chartered at [employer]. \Do you exercise at least 3 times a week- twice a week with walking. Is there a smoke alarm in your house- yes. Are there guns and firearms in your household- no . Have you experienced physical abuse-no         Usual # of hours of sleep per night 6-7 hours per night   Number of people living at your residence- Pt lives alone   Current Outpatient Medications on File Prior to Visit  Medication Sig Dispense Refill  . amLODipine (NORVASC) 5 MG tablet Take 1 tablet (5 mg total) by mouth daily. 180 tablet 1  . fluticasone (FLONASE) 50 MCG/ACT nasal spray Place into both nostrils daily.    . hydrochlorothiazide (HYDRODIURIL) 25 MG tablet TAKE 1 TABLET BY MOUTH EVERY DAY 90 tablet 1  . Loratadine (CLARITIN PO) Take by mouth daily.    . potassium chloride SA (KLOR-CON) 20 MEQ tablet Take 2 tablets (40 mEq total) by mouth daily. 180 tablet 0  . SYNTHROID 100 MCG tablet Take 1 tablet (100 mcg total) by mouth daily before breakfast. 7 tablet 0   No current facility-administered medications on file prior to visit.   No Known Allergies Family History  Problem Relation Age of Onset  . Hypertension Father   . Diabetes Father   . Hypothyroidism Father   . Cancer Father        prostate  . Breast cancer Mother 75   PE: BP 120/88 (BP Location: Right Arm, Patient Position: Sitting, Cuff Size: Normal)   Pulse 64   Ht 5' 5.5" (1.664 m)   Wt 234 lb (106.1 kg)   SpO2 98%   BMI 38.35 kg/m  Body mass index is 38.35 kg/m. Wt Readings from Last 3 Encounters:  03/25/21 234 lb (106.1 kg)  10/18/20 249 lb (112.9 kg)  01/16/20 253 lb (114.8 kg)   Constitutional: overweight, in NAD Eyes: PERRLA, EOMI, no exophthalmos ENT: moist mucous membranes, no neck masses palpated,  thyroidectomy scar healed with minimal keloid, no cervical lymphadenopathy Cardiovascular: RRR, No MRG Respiratory: CTA B Gastrointestinal: abdomen soft, NT, ND, BS+ Musculoskeletal: no deformities, strength intact in all 4 Skin: moist, warm, no rashes Neurological: no tremor with outstretched hands, DTR normal in all 4  ASSESSMENT: 1. Postsurgical Hypothyroidism - on Brand name Synthroid  2. Thyroid Cancer (stage 1 or 2) in remission  PLAN:  1. Patient with longstanding, postsurgical hypothyroidism, after total thyroidectomy for thyroid cancer - latest thyroid labs reviewed with pt >> normal: Lab Results  Component Value Date   TSH 2.79 01/12/2020   - she continues on LT4 (Synthroid d.a.w.) 100 mcg  daily - pt feels good on this dose. Lost 18 lbs since last OV- intentionally - we discussed about taking the thyroid hormone every day, with water, >30 minutes before breakfast, separated by >4 hours from acid reflux medications, calcium, iron, multivitamins. Pt. is taking it correctly. - will check thyroid tests today: TSH and fT4 - If labs are abnormal, she will need to return for repeat TFTs in 1.5 months  2. PTC -Her whole-body scans were negative after 2010.  She continues to have an undetectable thyroglobulin, last level checked in 12/2019.  The ATA antibodies are now undetectable.  She did have 1 instance of elevated ATA antibodies in the past so we are checking thyroglobulin with the [facility] assay -She denies neck compression symptoms and I do not feel any masses on palpation of her neck -In 2020 I ordered a neck ultrasound but she did not have this done.  At last visit, we decided to only check visit for thyroglobulin was increasing. -At today's visit we will recheck her thyroglobulin and ATA antibodies and proceed with neck ultrasound depending on the results -I will see her back in a year  Needs refills - 14 days (+ 90 days - only if dose change)  Component     Latest Ref  Rng & Units 03/25/2021  T4,Free(Direct)     0.60 - 1.60 ng/dL 1.14  TSH     0.35 - 4.50 uIU/mL 1.29  Thyroglobulin Antibody     0.0 - 0.9 IU/mL <1.0  Thyroglobulin by IMA     1.5 - 38.5 ng/mL <0.1 (L)   Labs are at goal.  Philemon Kingdom, MD PhD [facility] Endocrinology

## 2021-03-25 NOTE — Patient Instructions (Signed)
Please continue Synthroid 100 mcg daily. ? ?Take the thyroid hormone every day, with water, at least 30 minutes before breakfast, separated by at least 4 hours from: ?- acid reflux medications ?- calcium ?- iron ?- multivitamins ? ?Please stop at the lab. ? ?Please come back for a follow-up appointment in 1 year. ? ? ?

## 2021-03-26 ENCOUNTER — Other Ambulatory Visit: Payer: Self-pay | Admitting: Internal Medicine

## 2021-03-26 ENCOUNTER — Encounter: Payer: Self-pay | Admitting: Internal Medicine

## 2021-03-26 DIAGNOSIS — E890 Postprocedural hypothyroidism: Secondary | ICD-10-CM

## 2021-03-26 MED ORDER — SYNTHROID 100 MCG PO TABS: 100.0000 ug | ORAL_TABLET | Freq: Every day | ORAL | 3 refills | Status: DC

## 2021-03-27 ENCOUNTER — Encounter: Payer: Self-pay | Admitting: Internal Medicine

## 2021-03-27 DIAGNOSIS — E890 Postprocedural hypothyroidism: Secondary | ICD-10-CM

## 2021-03-27 LAB — THYROGLOBULIN BY IMA: Thyroglobulin by IMA: <0.1 ng/mL — ABNORMAL LOW (ref 1.5–38.5)

## 2021-03-27 LAB — TGAB+THYROGLOBULIN IMA OR LCMS: Thyroglobulin Antibody: <1 IU/mL (ref 0.0–0.9)

## 2021-03-27 MED ORDER — SYNTHROID 100 MCG PO TABS: 100.0000 ug | ORAL_TABLET | Freq: Every day | ORAL | 3 refills | Status: DC

## 2021-03-28 ENCOUNTER — Other Ambulatory Visit: Payer: Self-pay | Admitting: Internal Medicine

## 2021-04-01 MED ORDER — LEVOTHYROXINE SODIUM 100 MCG PO TABS: 100.0000 ug | ORAL_TABLET | Freq: Every day | ORAL | 3 refills | Status: DC

## 2021-04-01 NOTE — Addendum Note (Signed)
Addended by: Lauralyn Primes on: 04/01/2021 11:05 AM   Modules accepted: Orders

## 2021-04-14 ENCOUNTER — Other Ambulatory Visit: Payer: Self-pay | Admitting: Internal Medicine

## 2021-04-15 ENCOUNTER — Encounter (HOSPITAL_COMMUNITY): Payer: Self-pay | Admitting: Emergency Medicine

## 2021-04-15 ENCOUNTER — Emergency Department (HOSPITAL_COMMUNITY): Payer: BC Managed Care – PPO

## 2021-04-15 ENCOUNTER — Emergency Department (HOSPITAL_COMMUNITY)
Admission: EM | Admit: 2021-04-15 | Discharge: 2021-04-16 | Disposition: A | Discharge status: Home / Self Care | Payer: BC Managed Care – PPO | Attending: Emergency Medicine | Admitting: Emergency Medicine

## 2021-04-15 ENCOUNTER — Telehealth: Payer: Self-pay | Admitting: Internal Medicine

## 2021-04-15 ENCOUNTER — Ambulatory Visit: Payer: BC Managed Care – PPO | Admitting: Internal Medicine

## 2021-04-15 ENCOUNTER — Other Ambulatory Visit: Payer: Self-pay

## 2021-04-15 ENCOUNTER — Encounter: Payer: Self-pay | Admitting: Internal Medicine

## 2021-04-15 VITALS — BP 136/90 | HR 61 | Temp 98.5°F | Resp 18 | Ht 65.5 in | Wt 230.2 lb

## 2021-04-15 DIAGNOSIS — R079 Chest pain, unspecified: Secondary | ICD-10-CM | POA: Diagnosis not present

## 2021-04-15 DIAGNOSIS — I10 Essential (primary) hypertension: Secondary | ICD-10-CM | POA: Diagnosis not present

## 2021-04-15 DIAGNOSIS — Z85850 Personal history of malignant neoplasm of thyroid: Secondary | ICD-10-CM | POA: Insufficient documentation

## 2021-04-15 DIAGNOSIS — R0789 Other chest pain: Secondary | ICD-10-CM | POA: Diagnosis not present

## 2021-04-15 DIAGNOSIS — R9431 Abnormal electrocardiogram [ECG] [EKG]: Secondary | ICD-10-CM | POA: Diagnosis not present

## 2021-04-15 DIAGNOSIS — Z79899 Other long term (current) drug therapy: Secondary | ICD-10-CM | POA: Diagnosis not present

## 2021-04-15 DIAGNOSIS — E039 Hypothyroidism, unspecified: Secondary | ICD-10-CM | POA: Diagnosis not present

## 2021-04-15 DIAGNOSIS — Z87891 Personal history of nicotine dependence: Secondary | ICD-10-CM | POA: Diagnosis not present

## 2021-04-15 LAB — I-STAT BETA HCG BLOOD, ED (MC, WL, AP ONLY): I-stat hCG, quantitative: <5 m[IU]/mL (ref <5)

## 2021-04-15 LAB — BASIC METABOLIC PANEL
Anion gap: 9 (ref 5–15)
BUN: 6 mg/dL (ref 6–20)
CO2: 29 mmol/L (ref 22–32)
Calcium: 11 mg/dL — ABNORMAL HIGH (ref 8.9–10.3)
Chloride: 93 mmol/L — ABNORMAL LOW (ref 98–111)
Creatinine, Ser: 1.04 mg/dL — ABNORMAL HIGH (ref 0.44–1.00)
GFR, Estimated: >60 mL/min (ref >60)
Glucose, Bld: 97 mg/dL (ref 70–99)
Potassium: 3.1 mmol/L — ABNORMAL LOW (ref 3.5–5.1)
Sodium: 131 mmol/L — ABNORMAL LOW (ref 135–145)

## 2021-04-15 LAB — TROPONIN I (HIGH SENSITIVITY)
Troponin I (High Sensitivity): 9 ng/L (ref <18)
Troponin I (High Sensitivity): 8 ng/L (ref <18)

## 2021-04-15 LAB — CBC
HCT: 39.6 % (ref 36.0–46.0)
Hemoglobin: 14 g/dL (ref 12.0–15.0)
MCH: 32.3 pg (ref 26.0–34.0)
MCHC: 35.4 g/dL (ref 30.0–36.0)
MCV: 91.2 fL (ref 80.0–100.0)
Platelets: 307 10*3/uL (ref 150–400)
RBC: 4.34 MIL/uL (ref 3.87–5.11)
RDW: 12.7 % (ref 11.5–15.5)
WBC: 4.9 10*3/uL (ref 4.0–10.5)
nRBC: 0 % (ref 0.0–0.2)

## 2021-04-15 NOTE — Telephone Encounter (Signed)
Patient called and said that she started to experience chest discomfort on Sunday 5/29. She said she does not have any other symptoms. Transferred to [facility].

## 2021-04-15 NOTE — ED Provider Notes (Signed)
Emergency Medicine Provider Triage Evaluation Note  Shelly Baldwin , a 55 y.o. female  was evaluated in triage.  Pt complains of intermittent substernal chest tightness/pain for the past 3-4 days. Pain improved with belching and drinking water. Has not had any pain. No associated symptoms. Went to PCP today and found to have EKG changes with t wave inversions and sent here for further workup.  Review of Systems  Positive: + chest pain (none currently) Negative: - SOB, nausea, vomiting, diaphoresis  Physical Exam  BP (!) 146/83 (BP Location: Left Arm)   Pulse (!) 56   Temp 98.6 F (37 C) (Oral)   Resp 15   Ht 5' 5.5" (1.664 m)   Wt 104.3 kg   LMP 10/11/2017   SpO2 96%   BMI 37.69 kg/m  Gen:   Awake, no distress   Resp:  Normal effort  MSK:   Moves extremities without difficulty  Other:    Medical Decision Making  Medically screening exam initiated at 4:45 PM.  Appropriate orders placed.  Thalia Turkington was informed that the remainder of the evaluation will be completed by another provider, this initial triage assessment does not replace that evaluation, and the importance of remaining in the ED until their evaluation is complete.     Eustaquio Maize, PA-C 04/15/21 1646    Daleen Bo, MD 04/15/21 [phone]

## 2021-04-15 NOTE — Assessment & Plan Note (Signed)
New V2-V3 t wave inversion since prior 2017. In the setting of new chest pressure/discomfort needs ER evaluation. Sent to ER today and she will drive herself there for troponin and possible further cardiac evaluation. She does have hypertension and prior low potassium (most recent 2.9).

## 2021-04-15 NOTE — ED Triage Notes (Signed)
Pt sent by her primary doctor for ekg changes. Pt denies cp at this time. Pt reports intermittent episodes epigastric/chest burning over the past weekend, pt reports relief from pain after belching. Pt denies sob or pain at this time.

## 2021-04-15 NOTE — Assessment & Plan Note (Signed)
EKG done in the office with new changes compared to 2017. Advised to go to ER for further evaluation with troponin and consideration of stress testing/cath depending on results.

## 2021-04-15 NOTE — Progress Notes (Signed)
   Subjective:   Patient ID: Shelly Baldwin, female    DOB: 26-Sep-1966, 54 y.o.   MRN: [id number]  HPI The patient is a 55 YO female coming in for concerns about chest discomfort. 3 episodes over the weekend lasting about 5 minutes each. Was able to belch some and this helped with one of the episodes. No real trigger. Does walk a lot and no symptoms with this. No current chest pain. Has HTN but overall well controlled. Did have some dietary changes over the weekend.   PMH, [facility] social history reviewed and updated  Review of Systems  Constitutional: Negative.   HENT: Negative.   Eyes: Negative.   Respiratory: Positive for chest tightness. Negative for cough and shortness of breath.   Cardiovascular: Positive for chest pain. Negative for palpitations and leg swelling.  Gastrointestinal: Negative for abdominal distention, abdominal pain, constipation, diarrhea, nausea and vomiting.  Musculoskeletal: Negative.   Skin: Negative.   Neurological: Negative.   Psychiatric/Behavioral: Negative.     Objective:  Physical Exam Constitutional:      Appearance: She is well-developed. She is obese.  HENT:     Head: Normocephalic and atraumatic.  Cardiovascular:     Rate and Rhythm: Normal rate and regular rhythm.  Pulmonary:     Effort: Pulmonary effort is normal. No respiratory distress.     Breath sounds: Normal breath sounds. No wheezing or rales.     Comments: Chest pain not reproducible Chest:     Chest wall: No tenderness.  Abdominal:     General: Bowel sounds are normal. There is no distension.     Palpations: Abdomen is soft.     Tenderness: There is no abdominal tenderness. There is no rebound.  Musculoskeletal:     Cervical back: Normal range of motion.  Skin:    General: Skin is warm and dry.  Neurological:     Mental Status: She is alert and oriented to person, place, and time.     Coordination: Coordination normal.     Vitals:   04/15/21 1444  BP: 136/90  Pulse: 61   Resp: 18  Temp: 98.5 F (36.9 C)  TempSrc: Oral  SpO2: 96%  Weight: 230 lb 3.2 oz (104.4 kg)  Height: 5' 5.5" (1.664 m)   EKG: Rate 61, axis normal, interval mild LVH, sinus, st or t wave changes with new inversion of t wave in V2-V3, there is significant change compared to prior 2017   This visit occurred during the SARS-CoV-2 public health emergency.  Safety protocols were in place, including screening questions prior to the visit, additional usage of staff PPE, and extensive cleaning of exam room while observing appropriate contact time as indicated for disinfecting solutions.   Assessment & Plan:

## 2021-04-15 NOTE — Telephone Encounter (Signed)
[facility] FYI:  ---Caller states she is having discomfort in her chest. On sunday evening near the base of her right breast, she had a throbbing pain that went away after drinking some water. Yesterday she felt a pain right below breast in the upper abdomen. After she drank water and burped, it went away. No shortness of breath either time. Upper back was also bothering her at the same time. Right now, she is not having the pain at all. Each of the episodes lasted less than 5 minutes  Advised to see PCP within 24 hours; patient has an appointment 5.31.22

## 2021-04-16 NOTE — ED Provider Notes (Signed)
[facility] EMERGENCY DEPARTMENT Provider Note   CSN: [id number] Arrival date & time: 04/15/21  1530     History Chief Complaint  Patient presents with  . Abnormal ECG    Shelly Baldwin is a 55 y.o. female.  Patient with history of HTN, thyroidectomy, hypokalemia presents on the advice of her doctor for evaluation of abnormal EKG. She was seen by her doctor today for evaluation of chest pain she had been having over the last 3-4 days described as centrally located, sharp/burning, lasting a few minutes that resolved with belching. No SOB, nausea. No recent cough or fever. No recent dietary changes. She denies specific inciting factors. She has had no episodes of CP today.  The history is provided by the patient. No language interpreter was used.       Past Medical History:  Diagnosis Date  . Cancer (HCC)    undifferentiated thryoid  . Thyroid disease     Patient Active Problem List   Diagnosis Date Noted  . Abnormal EKG 04/15/2021  . Chest pressure 04/15/2021  . Hypokalemia 07/21/2020  . Hyperlipidemia 10/18/2019  . BPPV (benign paroxysmal positional vertigo), right 07/18/2019  . Anemia 08/31/2018  . Menorrhagia with irregular cycle 03/24/2017  . Hypertension 09/17/2015  . Morbid obesity ([place]) 12/15/2013  . Hypothyroidism, iatrogenic 08/04/2012  . Thyroid cancer [facility])     Past Surgical History:  Procedure Laterality Date  . G0P0    . THYROIDECTOMY     2005     OB History   No obstetric history on file.     Family History  Problem Relation Age of Onset  . Hypertension Father   . Diabetes Father   . Hypothyroidism Father   . Cancer Father        prostate  . Breast cancer Mother 48    Social History   Tobacco Use  . Smoking status: Former Smoker    Quit date: 08/13/1986    Years since quitting: 34.6  . Smokeless tobacco: Never Used  Substance Use Topics  . Alcohol use: No  . Drug use: No    Home Medications Prior to Admission  medications   Medication Sig Start Date End Date Taking? Authorizing Provider  amLODipine (NORVASC) 5 MG tablet TAKE 1 TABLET BY MOUTH EVERY DAY 03/31/21   Binnie Rail, MD  fluticasone (FLONASE) 50 MCG/ACT nasal spray Place into both nostrils daily.    [provider]  hydrochlorothiazide (HYDRODIURIL) 25 MG tablet TAKE 1 TABLET BY MOUTH EVERY DAY 12/26/20   Binnie Rail, MD  levothyroxine (SYNTHROID) 100 MCG tablet Take 1 tablet (100 mcg total) by mouth daily before breakfast. 04/01/21   Philemon Kingdom, MD  Loratadine (CLARITIN PO) Take by mouth daily.    [provider]  potassium chloride SA (KLOR-CON) 20 MEQ tablet Take 2 tablets (40 mEq total) by mouth daily. 03/14/21   Binnie Rail, MD    Allergies    Patient has no known allergies.  Review of Systems   Review of Systems  Constitutional: Negative for chills and fever.  HENT: Negative.   Respiratory: Negative.  Negative for shortness of breath.   Cardiovascular: Positive for chest pain.  Gastrointestinal: Negative.  Negative for abdominal pain and nausea.  Musculoskeletal: Negative.  Negative for myalgias.  Skin: Negative.   Neurological: Negative.  Negative for weakness.    Physical Exam Updated Vital Signs BP (!) 114/95 (BP Location: Right Arm)   Pulse (!) 55  Temp 98.6 F (37 C) (Oral)   Resp 16   Ht 5' 5.5" (1.664 m)   Wt 104.3 kg   LMP 10/11/2017   SpO2 99%   BMI 37.69 kg/m   Physical Exam Vitals and nursing note reviewed.  Constitutional:      Appearance: She is well-developed.  HENT:     Head: Normocephalic.  Cardiovascular:     Rate and Rhythm: Normal rate and regular rhythm.     Heart sounds: No murmur heard.   Pulmonary:     Effort: Pulmonary effort is normal.     Breath sounds: Normal breath sounds. No wheezing, rhonchi or rales.  Abdominal:     General: Bowel sounds are normal.     Palpations: Abdomen is soft.     Tenderness: There is no abdominal tenderness. There is  no guarding or rebound.  Musculoskeletal:        General: Normal range of motion.     Cervical back: Normal range of motion and neck supple.     Right lower leg: No edema.     Left lower leg: No edema.  Skin:    General: Skin is warm and dry.     Findings: No rash.  Neurological:     Mental Status: She is alert and oriented to person, place, and time.     ED Results / Procedures / Treatments   Labs (all labs ordered are listed, but only abnormal results are displayed) Labs Reviewed  BASIC METABOLIC PANEL - Abnormal; Notable for the following components:      Result Value   Sodium 131 (*)    Potassium 3.1 (*)    Chloride 93 (*)    Creatinine, Ser 1.04 (*)    Calcium 11.0 (*)    All other components within normal limits  CBC  I-STAT BETA HCG BLOOD, ED ([facility], [facility], [facility] ONLY)  TROPONIN I (HIGH SENSITIVITY)  TROPONIN I (HIGH SENSITIVITY)   Results for orders placed or performed during the hospital encounter of 17/71/16  Basic metabolic panel  Result Value Ref Range   Sodium 131 (L) 135 - 145 mmol/L   Potassium 3.1 (L) 3.5 - 5.1 mmol/L   Chloride 93 (L) 98 - 111 mmol/L   CO2 29 22 - 32 mmol/L   Glucose, Bld 97 70 - 99 mg/dL   BUN 6 6 - 20 mg/dL   Creatinine, Ser 1.04 (H) 0.44 - 1.00 mg/dL   Calcium 11.0 (H) 8.9 - 10.3 mg/dL   GFR, Estimated >60 >60 mL/min   Anion gap 9 5 - 15  CBC  Result Value Ref Range   WBC 4.9 4.0 - 10.5 K/uL   RBC 4.34 3.87 - 5.11 MIL/uL   Hemoglobin 14.0 12.0 - 15.0 g/dL   HCT 39.6 36.0 - 46.0 %   MCV 91.2 80.0 - 100.0 fL   MCH 32.3 26.0 - 34.0 pg   MCHC 35.4 30.0 - 36.0 g/dL   RDW 12.7 11.5 - 15.5 %   Platelets 307 150 - 400 K/uL   nRBC 0.0 0.0 - 0.2 %  I-Stat beta hCG blood, ED  Result Value Ref Range   I-stat hCG, quantitative <5.0 <5 mIU/mL   Comment 3          Troponin I (High Sensitivity)  Result Value Ref Range   Troponin I (High Sensitivity) 9 <18 ng/L  Troponin I (High Sensitivity)  Result Value Ref Range   Troponin I (High  Sensitivity) 8 <18 ng/L  EKG EKG Interpretation  Date/Time:  Tuesday Apr 15 2021 16:37:24 EDT Ventricular Rate:  61 PR Interval:  152 QRS Duration: 96 QT Interval:  442 QTC Calculation: 444 R Axis:   -13 Text Interpretation: Sinus rhythm with Premature atrial complexes Moderate voltage criteria for LVH, may be normal variant ( R in aVL , Cornell product ) ST & T wave abnormality, consider anterior ischemia Abnormal ECG Since last tracing ST-t wave abnormality is new Otherwise no significant change Confirmed by Daleen Bo [phone]) on 04/15/2021 11:56:01 PM   Radiology DG Chest 2 View  Result Date: 04/15/2021 CLINICAL DATA:  Onset mid chest pain 04/11/2021. EXAM: CHEST - 2 VIEW COMPARISON:  PA and lateral chest 05/17/2016. FINDINGS: The lungs are clear. Heart size is normal. No pneumothorax or pleural effusion. No acute or focal bony abnormality. S-shaped thoracolumbar scoliosis appears unchanged. IMPRESSION: No acute disease. Electronically Signed   By: Inge Rise M.D.   On: 04/15/2021 17:20    Procedures Procedures   Medications Ordered in ED Medications - No data to display  ED Course  I have reviewed the triage vital signs and the nursing notes.  Pertinent labs & imaging results that were available during my care of the patient were reviewed by me and considered in my medical decision making (see chart for details).    MDM Rules/Calculators/A&P                          Patient to ED on the advice of her doctor for abnormal EKG, as detailed in the HPI.   No current pain. Labs, including troponin x 2, are negative. She does have a potassium of 3.1 but reports she takes a potassium supplement now twice daily. Has not had her afternoon dose.   EKG reviewed by Dr. Leonette Monarch. No specific ischemic changes, but does show nonspecific ST-T wave changes.   She is considered appropriate for discharge home. Will refer to cardiology to continue outpatient evaluation/work up.    Final Clinical Impression(s) / ED Diagnoses Final diagnoses:  None   1. Nonspecific chest pain 2. Abnormal EKG  Rx / DC Orders ED Discharge Orders    None       Charlann Lange, PA-C 04/16/21 0146    Fatima Blank, MD 04/20/21 [phone]

## 2021-04-16 NOTE — Discharge Instructions (Signed)
Your labs and chest x-ray are normal and do not indicate an acute heart issue. Your EKG abnormalities are nonspecific but should be further evaluated in the outpatient setting by cardiology.   Return to the ED with any new or worsening symptoms, persistent chest pain, SOB, nausea. Continue your medications as prescribed.

## 2021-05-05 ENCOUNTER — Ambulatory Visit: Payer: BC Managed Care – PPO | Admitting: Internal Medicine

## 2021-05-06 ENCOUNTER — Ambulatory Visit: Payer: BC Managed Care – PPO | Admitting: Internal Medicine

## 2021-06-12 ENCOUNTER — Other Ambulatory Visit: Payer: Self-pay | Admitting: Internal Medicine

## 2021-06-12 ENCOUNTER — Encounter: Payer: Self-pay | Admitting: Internal Medicine

## 2021-08-29 ENCOUNTER — Other Ambulatory Visit: Payer: Self-pay | Admitting: Family Medicine

## 2021-08-29 DIAGNOSIS — Z1231 Encounter for screening mammogram for malignant neoplasm of breast: Secondary | ICD-10-CM

## 2021-09-04 ENCOUNTER — Other Ambulatory Visit: Payer: Self-pay | Admitting: Internal Medicine

## 2021-09-29 ENCOUNTER — Ambulatory Visit
Admission: RE | Admit: 2021-09-29 | Discharge: 2021-09-29 | Disposition: A | Discharge status: Home / Self Care | Payer: BC Managed Care – PPO | Source: Ambulatory Visit

## 2021-09-29 DIAGNOSIS — Z1231 Encounter for screening mammogram for malignant neoplasm of breast: Secondary | ICD-10-CM

## 2022-03-04 ENCOUNTER — Other Ambulatory Visit: Payer: Self-pay | Admitting: Internal Medicine

## 2022-03-04 DIAGNOSIS — E890 Postprocedural hypothyroidism: Secondary | ICD-10-CM

## 2022-03-26 ENCOUNTER — Encounter: Payer: Self-pay | Admitting: Internal Medicine

## 2022-03-26 ENCOUNTER — Ambulatory Visit (INDEPENDENT_AMBULATORY_CARE_PROVIDER_SITE_OTHER): Payer: BC Managed Care – PPO | Admitting: Internal Medicine

## 2022-03-26 VITALS — BP 120/84 | HR 50 | Ht 65.5 in | Wt 223.0 lb

## 2022-03-26 DIAGNOSIS — C73 Malignant neoplasm of thyroid gland: Secondary | ICD-10-CM

## 2022-03-26 DIAGNOSIS — E890 Postprocedural hypothyroidism: Secondary | ICD-10-CM | POA: Diagnosis not present

## 2022-03-26 MED ORDER — LEVOTHYROXINE SODIUM 100 MCG PO TABS: 100.0000 ug | ORAL_TABLET | Freq: Every day | ORAL | 3 refills | Status: DC

## 2022-03-26 NOTE — Progress Notes (Signed)
Patient ID: Shelly Baldwin, female   DOB: October 22, 1966, 56 y.o.   MRN: [id number] ? ?This visit occurred during the SARS-CoV-2 public health emergency.  Safety protocols were in place, including screening questions prior to the visit, additional usage of staff PPE, and extensive cleaning of exam room while observing appropriate contact time as indicated for disinfecting solutions.  ? 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 Last visit 1 year ago. ? ?Interim history: ?She continues to work from home  2 days a week.  She walks twice a day. ?Per insurance, she had to switch from Synthroid to Levoxyl >> abdominal pain, did not feel well.  Before last visit, she switched back on Synthroid but for insurance coverage, she needs to obtain them from the mail order pharmacy. ?She has more acid reflux >> started dietary changes. ? ?Reviewed history: ?Pt. has been found to have a goiter with neck compression symptoms sxs, then had Bx >> inconclusive >> total thyroidectomy >> dx with thyroid cancer in 2004, had total thyroidectomy in 2005. ? ?She remembers having had RAI treatment and a whole-body scan posttreatment. She had rTSH - stim WBS every year for 5 years >> no mets (up to 2010) ? ?She is on Synthroid d.a.w. 100 mcg daily (dose changed 12/2018): ?- in am ?- fasting ?- at least 60 min from b'fast ?- no Ca, no PPI ?- + Fe >4h after LT4 ?- + MVI 2.5-3h later ?- not on Biotin ? ? ?Reviewed TFTs: ? 02/26/22 08/28/21  ?TSH 1.79  1.42   ? ?Lab Results  ?Component Value Date  ? TSH 1.29 03/25/2021  ? TSH 2.79 01/12/2020  ? TSH 0.15 (L) 01/11/2019  ? TSH 0.65 03/14/2018  ? TSH 0.30 (L) 01/11/2018  ? TSH 1.65 03/24/2017  ? TSH 0.17 (L) 01/11/2017  ? TSH 2.26 11/29/2015  ? TSH 0.42 11/26/2014  ? TSH 0.011 (L) 09/26/2014  ? FREET4 1.14 03/25/2021  ? FREET4 0.98 01/12/2020  ? FREET4 1.22 01/11/2019  ? FREET4 0.96 03/14/2018  ? FREET4 0.96 01/11/2018  ? FREET4  1.04 01/11/2017  ? FREET4 0.86 11/29/2015  ? FREET4 0.95 11/26/2014  ? FREET4 1.59 09/26/2014  ? FREET4 1.50 03/22/2014  ?  ?Her thyroglobulin levels remain undetectable: ?Lab Results  ?Component Value Date  ? THYROGLB <0.1 (L) 03/25/2021  ? THYROGLB <0.1 (L) 01/12/2020  ? THYROGLB <0.1 (L) 01/11/2019  ? THYROGLB <0.1 (L) 01/11/2018  ? THYROGLB <0.1 (L) 01/11/2017  ? THYROGLB <2.0 11/29/2015  ? THYROGLB <2.0 09/26/2014  ? THYROGLB <0.2 11/24/2013  ? THYROGLB <0.2 08/14/2011  ? ?She had 1 instance of elevated ATA antibodies: ?Lab Results  ?Component Value Date  ? THGAB <1.0 03/25/2021  ? THGAB <1.0 01/12/2020  ? THGAB <1.0 01/11/2019  ? THGAB <1.0 01/11/2018  ? THGAB <1.0 01/11/2017  ? THGAB <1.0 11/29/2015  ? THGAB <1.0 09/26/2014  ? THGAB 485.0 (H) 11/24/2013  ? THGAB <20.0 08/14/2011  ?Need to send Tg + ATA to [facility]. ? ?Pt denies: ?- feeling nodules in neck ?- hoarseness ?- dysphagia ?- choking ?- SOB with lying down ? ?She also has HTN, HL. ?She has BPPV - episodic.  ? ?ROS: ?+ see HPI ? ?I reviewed pt's medications, allergies, PMH, social hx, family hx, and changes were documented in the history of present illness. Otherwise, unchanged from my initial visit note. ? ?Past Medical History:  ?Diagnosis Date  ? Cancer [facility])   ?  undifferentiated thryoid  ? Thyroid disease   ? ?Past Surgical History:  ?Procedure Laterality Date  ? G0P0    ? THYROIDECTOMY    ? 2005  ? ?History  ? ?Social History  ? Marital Status: Single  ?  Spouse Name: N/A  ?  Number of Children: 0  ? Years of Education: 66  ? 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 ?Social History Main Topics  ? Smoking status: Former Smoker  ?  Quit date: 08/13/1986  ? Smokeless tobacco: Never Used  ? Alcohol Use: No  ? Drug Use: No  ? ?Social History Narrative  ? [employer], [employer] in [place], matriculating [employer] (Sept '12). Single. Work - Loss adjuster, chartered at [employer]. \Do you exercise at least 3 times a week- twice a week with walking. Is there a  smoke alarm in your house- yes. Are there guns and firearms in your household- no . Have you experienced physical abuse-no  ?   ?   ? Usual # of hours of sleep per night 6-7 hours per night  ? Number of people living at your residence- Pt lives alone  ? ?Current Outpatient Medications on File Prior to Visit  ?Medication Sig Dispense Refill  ? amLODipine (NORVASC) 5 MG tablet TAKE 1 TABLET BY MOUTH EVERY DAY 90 tablet 1  ? fluticasone (FLONASE) 50 MCG/ACT nasal spray Place into both nostrils daily.    ? hydrochlorothiazide (HYDRODIURIL) 25 MG tablet TAKE 1 TABLET BY MOUTH EVERY DAY 90 tablet 1  ? KLOR-CON M20 20 MEQ tablet TAKE 2 TABLETS BY MOUTH DAILY 180 tablet 0  ? levothyroxine (SYNTHROID) 100 MCG tablet TAKE 1 TABLET DAILY BEFORE BREAKFAST 90 tablet 0  ? Loratadine (CLARITIN PO) Take by mouth daily.    ? ?No current facility-administered medications on file prior to visit.  ? ?No Known Allergies ?Family History  ?Problem Relation Age of Onset  ? Hypertension Father   ? Diabetes Father   ? Hypothyroidism Father   ? Cancer Father   ?     prostate  ? Breast cancer Mother 60  ? ?PE: ?BP 120/84 (BP Location: Left Arm, Patient Position: Sitting, Cuff Size: Normal)   Pulse (!) 50   Ht 5' 5.5" (1.664 m)   Wt 223 lb (101.2 kg)   LMP 10/11/2017   SpO2 99%   BMI 36.54 kg/m?   ?Wt Readings from Last 3 Encounters:  ?03/26/22 223 lb (101.2 kg)  ?04/16/21 230 lb (104.3 kg)  ?04/15/21 230 lb 3.2 oz (104.4 kg)  ? ?Constitutional: overweight, in NAD ?Eyes: PERRLA, EOMI, no exophthalmos ?ENT: moist mucous membranes, no neck masses palpated, thyroidectomy scar healed with minimal keloid, no cervical lymphadenopathy ?Cardiovascular: RRR, No MRG ?Respiratory: CTA B ?Musculoskeletal: no deformities, strength intact in all 4 ?Skin: moist, warm, no rashes ?Neurological: no tremor with outstretched hands, DTR normal in all 4 ? ?ASSESSMENT: ?1. Postsurgical Hypothyroidism ?- on Brand name Synthroid ? ?2. Thyroid Cancer (stage 1 or  2) in remission ? ?PLAN:  ?1. Patient with longstanding, postsurgical hypothyroidism, after total thyroidectomy for thyroid cancer ?- latest thyroid labs reviewed with pt. >> normal in 02/2022 ?- she continues on Synthroid d.a.w. 100 mcg daily ?- pt feels good on this dose  ?- I refilled her Synthroid ?- we discussed about taking the thyroid hormone every day, with water, >30 minutes before breakfast, separated by >4 hours from acid reflux medications, calcium, iron, multivitamins. Pt. is taking it correctly, but I advised her  to hold the multivitamins slightly later, ideally more than 4 hours after levothyroxine ? 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  ATA antibodies remain undetectable.  She did have 1 instance of elevated ATA antibodies in the past so we are checking thyroglobulin with the [facility] assay ?-At today's visit, she denies neck compression symptoms and no masses are felt on palpation of her neck ?-We will check her thyroglobulin and ATA antibodies now ?-I ordered a neck ultrasound in 2020, but she did not have this done ?-At today's visit, I did suggest a neck ultrasound -we will reorder ? ?Component ?    Latest Ref Rng 03/26/2022  ?Thyroglobulin Antibody ?    0.0 - 0.9 IU/mL <1.0   ?Thyroglobulin by IMA ?    1.5 - 38.5 ng/mL <0.1 (L)   ?  ?Excellent labs. ? ?Philemon Kingdom, MD PhD ?[facility] End[facility] ? ?

## 2022-03-26 NOTE — Patient Instructions (Signed)
Please continue Synthroid 100 mcg daily. ? ?Take the thyroid hormone every day, with water, at least 30 minutes before breakfast, separated by at least 4 hours from: ?- acid reflux medications ?- calcium ?- iron ?- multivitamins ? ?Please stop at the lab. ? ?Please come back for a follow-up appointment in 1 year. ? ? ?

## 2022-03-28 LAB — THYROGLOBULIN BY IMA: Thyroglobulin by IMA: <0.1 ng/mL — ABNORMAL LOW (ref 1.5–38.5)

## 2022-03-28 LAB — TGAB+THYROGLOBULIN IMA OR LCMS: Thyroglobulin Antibody: <1 IU/mL (ref 0.0–0.9)

## 2022-05-07 ENCOUNTER — Ambulatory Visit
Admission: RE | Admit: 2022-05-07 | Discharge: 2022-05-07 | Disposition: A | Discharge status: Home / Self Care | Payer: BC Managed Care – PPO | Source: Ambulatory Visit | Attending: Internal Medicine | Admitting: Internal Medicine

## 2022-05-07 DIAGNOSIS — C73 Malignant neoplasm of thyroid gland: Secondary | ICD-10-CM

## 2022-08-31 ENCOUNTER — Other Ambulatory Visit: Payer: Self-pay | Admitting: Family Medicine

## 2022-08-31 DIAGNOSIS — Z1231 Encounter for screening mammogram for malignant neoplasm of breast: Secondary | ICD-10-CM

## 2022-10-16 ENCOUNTER — Ambulatory Visit
Admission: RE | Admit: 2022-10-16 | Discharge: 2022-10-16 | Disposition: A | Discharge status: Home / Self Care | Payer: BC Managed Care – PPO | Source: Ambulatory Visit | Attending: Family Medicine | Admitting: Family Medicine

## 2022-10-16 DIAGNOSIS — Z1231 Encounter for screening mammogram for malignant neoplasm of breast: Secondary | ICD-10-CM

## 2023-01-19 IMAGING — MG MM DIGITAL SCREENING BILAT W/ TOMO AND CAD
8 series · 8 of 24 positions shown · non-contrast
Comparison: Previous exam(s).

CLINICAL DATA: Screening.

EXAM:
DIGITAL SCREENING BILATERAL MAMMOGRAM WITH TOMOSYNTHESIS AND CAD
TECHNIQUE: Bilateral screening digital craniocaudal and mediolateral oblique
mammograms were obtained. Bilateral screening digital breast
tomosynthesis was performed. The images were evaluated with
computer-aided detection.

[L CC synth-2D]
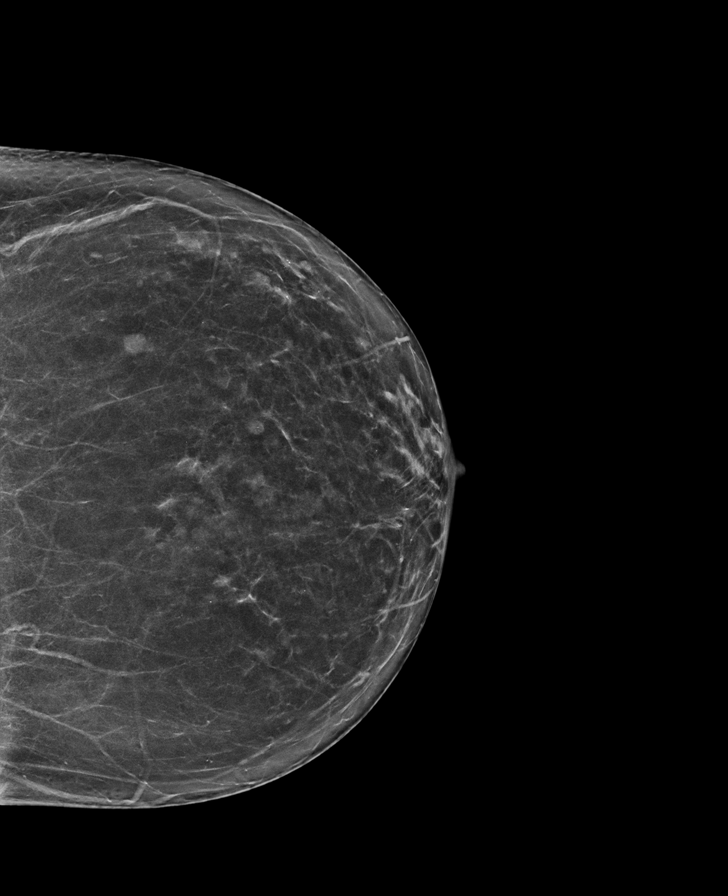

[R CC synth-2D]
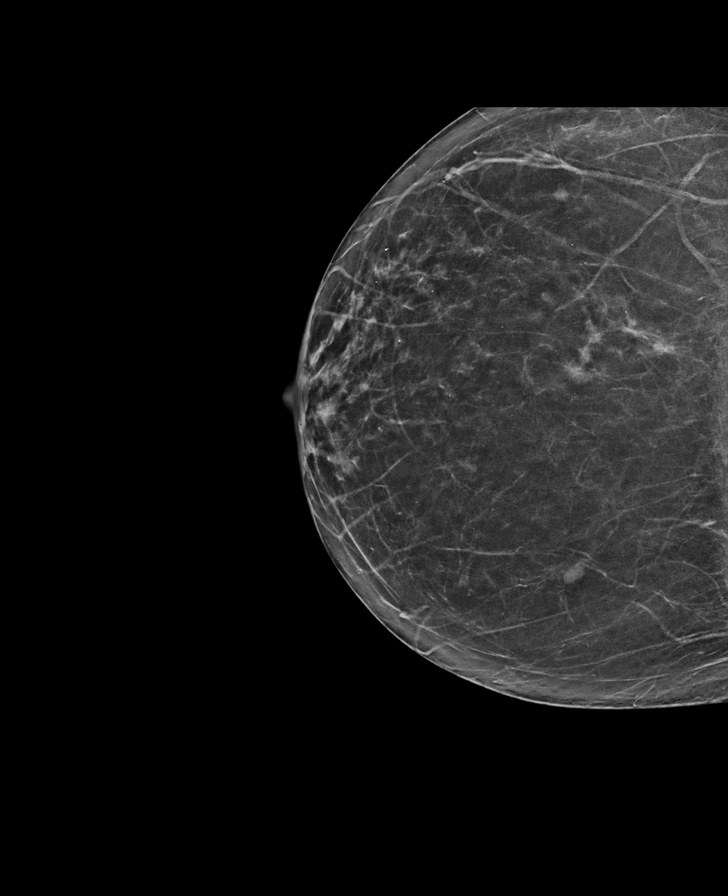

[L MLO synth-2D]
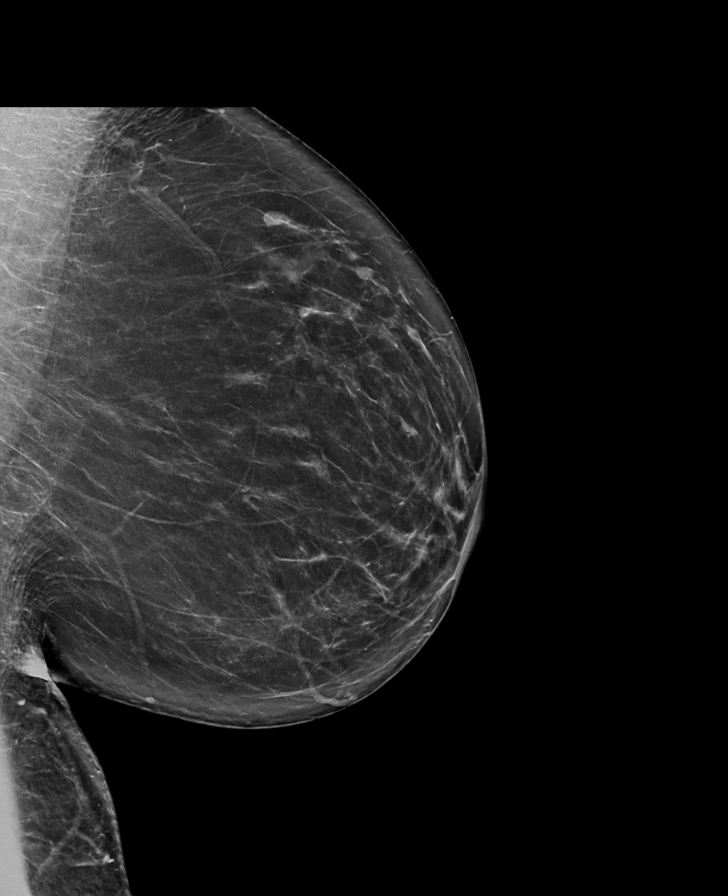

[R MLO synth-2D]
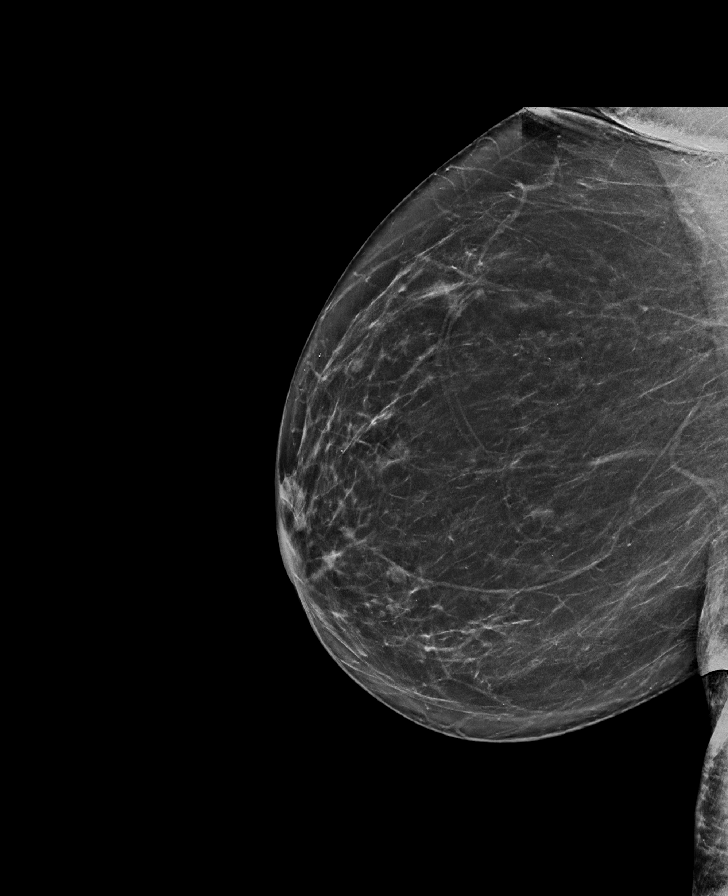

[R CC tomo · tomo slice 33/64.0]
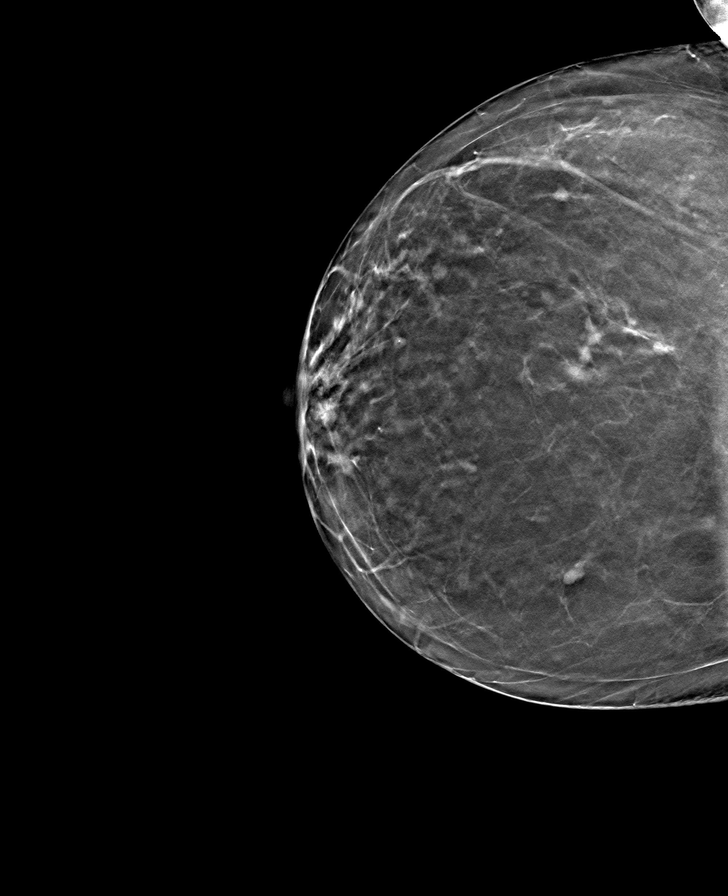

[L MLO tomo · tomo slice 39/78.0]
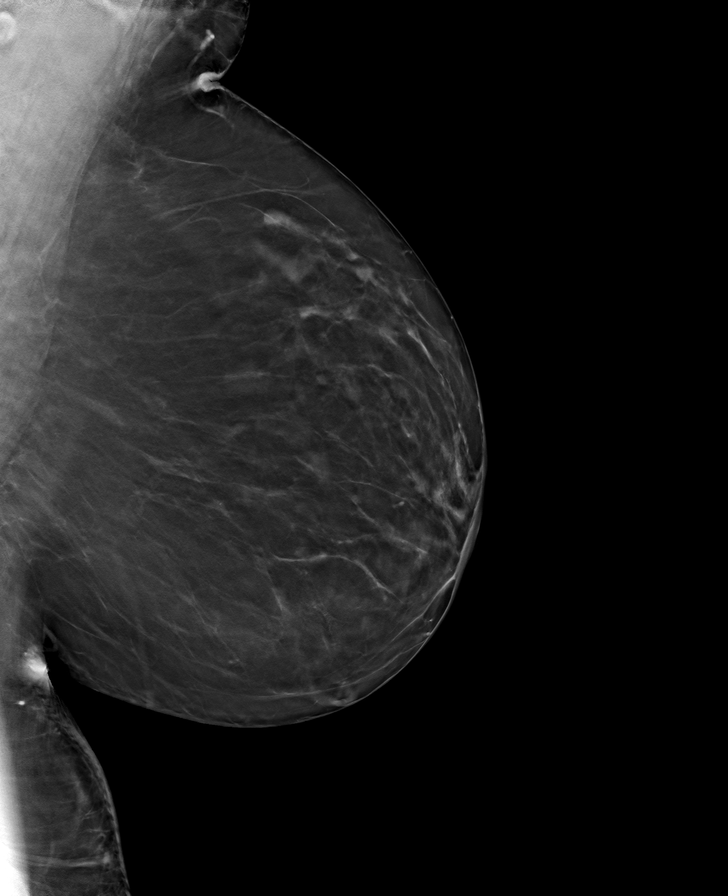

[R MLO tomo · tomo slice 39/77.0]
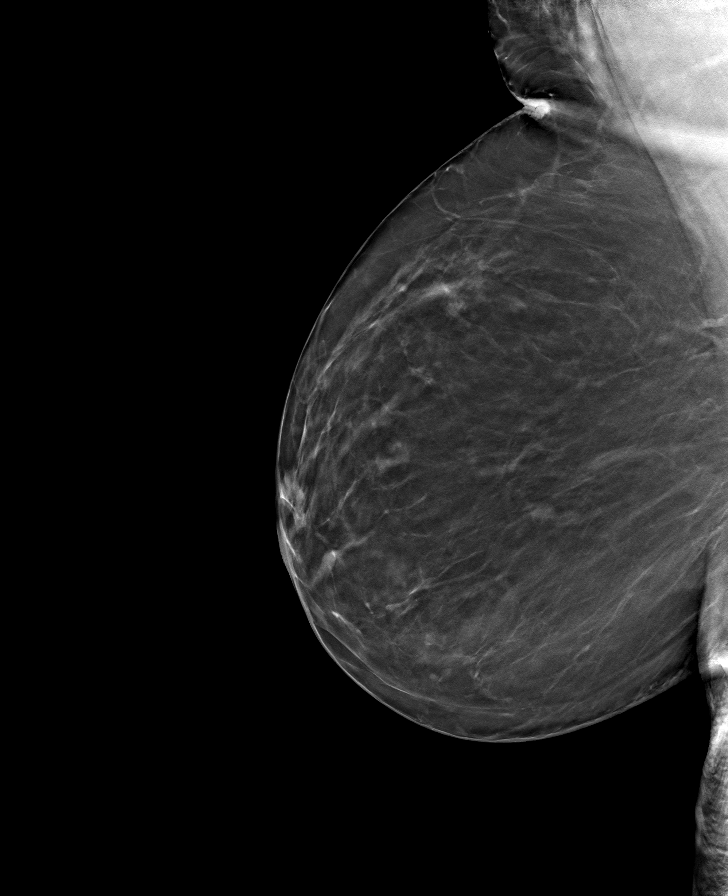

[L CC tomo · tomo slice 34/67.0]
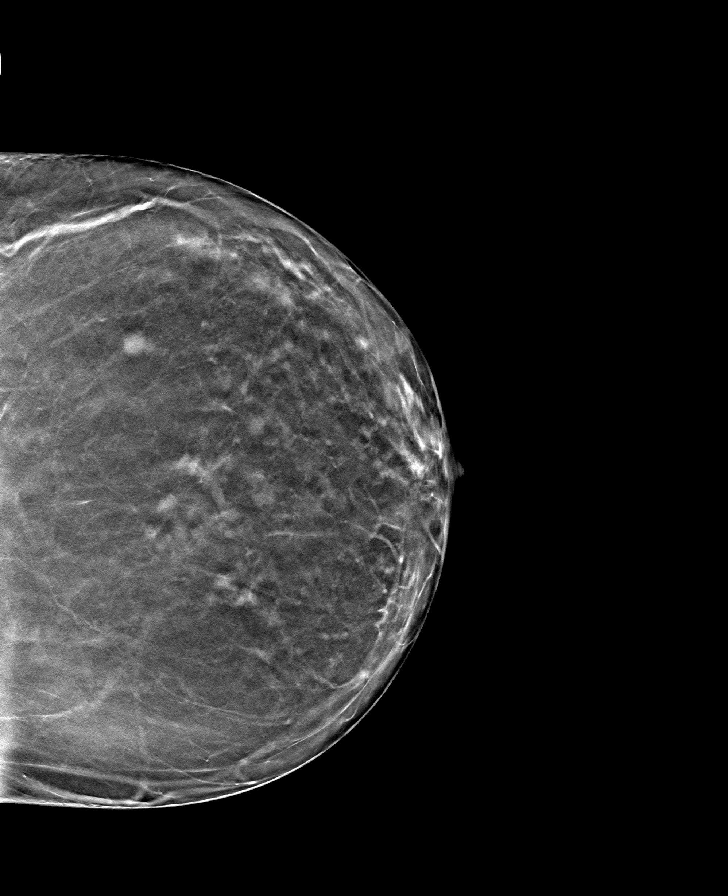

[8 of 24 positions shown; findings below may reference images not displayed]

ACR Breast Density Category b: There are scattered areas of
fibroglandular density.
FINDINGS: There are no findings suspicious for malignancy.
IMPRESSION: No mammographic evidence of malignancy. A result letter of this
screening mammogram will be mailed directly to the patient.

RECOMMENDATION:
Screening mammogram in one year. (Code:51-O-LD2)

BI-RADS CATEGORY  1: Negative.

## 2023-04-01 ENCOUNTER — Ambulatory Visit: Payer: 59 | Admitting: Internal Medicine

## 2023-04-01 ENCOUNTER — Encounter: Payer: Self-pay | Admitting: Internal Medicine

## 2023-04-01 VITALS — BP 130/92 | HR 53 | Ht 65.5 in | Wt 227.6 lb

## 2023-04-01 DIAGNOSIS — E890 Postprocedural hypothyroidism: Secondary | ICD-10-CM

## 2023-04-01 DIAGNOSIS — C73 Malignant neoplasm of thyroid gland: Secondary | ICD-10-CM

## 2023-04-01 LAB — TSH: TSH: 0.56 u[IU]/mL (ref 0.35–5.50)

## 2023-04-01 LAB — T4, FREE: Free T4: 1.13 ng/dL (ref 0.60–1.60)

## 2023-04-01 NOTE — Progress Notes (Signed)
Patient ID: Shelly Baldwin, female   DOB: 09-01-1966, 57 y.o.   MRN: [id number]  HPI  Shelly Baldwin is a 57 y.o.-year-old female, returning for f/u for thyroid cancer and postsurgical hypothyroidism. Last visit 1 year ago.  Interim history: She has more acid reflux >> started dietary changes >> resolved. She went send small fluctuations in her weight, but otherwise denies tremors, palpitations, heat intolerance, anxiety, increased fatigue, constipation.  Reviewed and addended history: Pt. has been found to have a goiter with neck compression symptoms sxs, then had Bx >> inconclusive >> total thyroidectomy >> dx with thyroid cancer in 2004, had total thyroidectomy in 2005.  She remembers having had RAI treatment and a whole-body scan posttreatment. She had rTSH - stim WBS every year for 5 years >> no mets (up to 2010).  Per insurance preference, she had to switch from Synthroid to Levoxyl >> abdominal pain, did not feel well.  We then switched back to Synthroid but she needs to obtain it from the mail order pharmacy.  Thyroid U/S (05/07/2022): Post total thyroidectomy without evidence of residual or locally recurrent disease.  She is on Synthroid d.a.w. 100 mcg daily (dose changed 12/2018): - in am - fasting - at least 60 min from b'fast - no Ca, no PPI - + Fe >4h after LT4 - + MVI 4h later  - not on Biotin On B12, D , E, omega 3 FAs.  Reviewed TFTs:  02/26/22 08/28/21  TSH 1.79  1.42    Lab Results  Component Value Date   TSH 1.29 03/25/2021   TSH 2.79 01/12/2020   TSH 0.15 (L) 01/11/2019   TSH 0.65 03/14/2018   TSH 0.30 (L) 01/11/2018   TSH 1.65 03/24/2017   TSH 0.17 (L) 01/11/2017   TSH 2.26 11/29/2015   TSH 0.42 11/26/2014   TSH 0.011 (L) 09/26/2014   FREET4 1.14 03/25/2021   FREET4 0.98 01/12/2020   FREET4 1.22 01/11/2019   FREET4 0.96 03/14/2018   FREET4 0.96 01/11/2018   FREET4 1.04 01/11/2017   FREET4 0.86 11/29/2015   FREET4 0.95 11/26/2014   FREET4 1.59  09/26/2014   FREET4 1.50 03/22/2014    Her thyroglobulin levels remain undetectable: Lab Results  Component Value Date   THYROGLB <0.1 (L) 03/26/2022   THYROGLB <0.1 (L) 03/25/2021   THYROGLB <0.1 (L) 01/12/2020   THYROGLB <0.1 (L) 01/11/2019   THYROGLB <0.1 (L) 01/11/2018   THYROGLB <0.1 (L) 01/11/2017   THYROGLB <2.0 11/29/2015   THYROGLB <2.0 09/26/2014   THYROGLB <0.2 11/24/2013   THYROGLB <0.2 08/14/2011   She had 1 instance of elevated ATA antibodies: Lab Results  Component Value Date   THGAB <1.0 03/26/2022   THGAB <1.0 03/25/2021   THGAB <1.0 01/12/2020   THGAB <1.0 01/11/2019   THGAB <1.0 01/11/2018   THGAB <1.0 01/11/2017   THGAB <1.0 11/29/2015   THGAB <1.0 09/26/2014   THGAB 485.0 (H) 11/24/2013   THGAB <20.0 08/14/2011  Need to send Tg + ATA to [facility].  Pt denies: - feeling nodules in neck - hoarseness - dysphagia - choking  She also has HTN, HL. She has a history of episodic BPPV.  She had a slightly elevated calcium level last month, at 10.7, but an albumin level was 4.6 and the corrected calcium was normal, at 10.2.  ROS: + see HPI  I reviewed pt's medications, allergies, PMH, social hx, family hx, and changes were documented in the history of present illness. Otherwise, unchanged from my initial visit  note.  Past Medical History:  Diagnosis Date   Cancer (HCC)    undifferentiated thryoid   Thyroid disease    Past Surgical History:  Procedure Laterality Date   G0P0     THYROIDECTOMY     2005   History   Social History   Marital Status: Single    Spouse Name: N/A    Number of Children: 0   Years of Education: 14   Occupational History   technical support    Social History Main Topics   Smoking status: Former Smoker    Quit date: 08/13/1986   Smokeless tobacco: Never Used   Alcohol Use: No   Drug Use: No   Social History Narrative   HSG, [employer] in [place], matriculating [employer] (Sept '12). Single. Work - Press photographer at [employer]. \Do you exercise at least 3 times a week- twice a week with walking. Is there a smoke alarm in your house- yes. Are there guns and firearms in your household- no . Have you experienced physical abuse-no         Usual # of hours of sleep per night 6-7 hours per night   Number of people living at your residence- Pt lives alone   Current Outpatient Medications on File Prior to Visit  Medication Sig Dispense Refill   amLODipine (NORVASC) 5 MG tablet TAKE 1 TABLET BY MOUTH EVERY DAY 90 tablet 1   fluticasone (FLONASE) 50 MCG/ACT nasal spray Place into both nostrils daily.     hydrochlorothiazide (HYDRODIURIL) 25 MG tablet TAKE 1 TABLET BY MOUTH EVERY DAY 90 tablet 1   KLOR-CON M20 20 MEQ tablet TAKE 2 TABLETS BY MOUTH DAILY 180 tablet 0   levothyroxine (SYNTHROID) 100 MCG tablet Take 1 tablet (100 mcg total) by mouth daily before breakfast. 90 tablet 3   Loratadine (CLARITIN PO) Take by mouth daily.     rosuvastatin (CRESTOR) 10 MG tablet Take 10 mg by mouth daily.     No current facility-administered medications on file prior to visit.   No Known Allergies Family History  Problem Relation Age of Onset   Hypertension Father    Diabetes Father    Hypothyroidism Father    Cancer Father        prostate   Breast cancer Mother 29   PE: BP (!) 130/92 (BP Location: Left Arm, Patient Position: Sitting, Cuff Size: Normal)   Pulse (!) 53   Ht 5' 5.5" (1.664 m)   Wt 227 lb 9.6 oz (103.2 kg)   LMP 10/11/2017   SpO2 98%   BMI 37.30 kg/m   Wt Readings from Last 3 Encounters:  04/01/23 227 lb 9.6 oz (103.2 kg)  03/26/22 223 lb (101.2 kg)  04/16/21 230 lb (104.3 kg)   Constitutional: overweight, in NAD Eyes: EOMI, no exophthalmos ENT: no neck masses palpated, thyroidectomy scar healed with minimal keloid, no cervical lymphadenopathy Cardiovascular: RRR, No MRG Respiratory: CTA B Musculoskeletal: no deformities Skin: no rashes Neurological: no tremor with outstretched  hands  ASSESSMENT: 1. Postsurgical Hypothyroidism - on Brand name Synthroid  2. Thyroid Cancer (stage 1 or 2) in remission  PLAN:  1. Patient with longstanding, postsurgical hypothyroidism, after total thyroidectomy for thyroid cancer - latest thyroid labs reviewed with pt. >> normal: Lab Results  Component Value Date   TSH 1.29 03/25/2021  - she continues on LT4 (Synthroid d.a.w.) 100 mcg daily - pt feels good on this dose, without complaints. - we discussed about taking the thyroid  hormone every day, with water, >30 minutes before breakfast, separated by >4 hours from acid reflux medications, calcium, iron, multivitamins. Pt. is taking it correctly. - will check thyroid tests today: TSH and fT4 - If labs are abnormal, she will need to return for repeat TFTs in 1.5 months  2. PTC -In remission -Her whole-body scans were negative for recurrence or metastasis after 2010 -She continues to have an undetectable thyroglobulin with the latest level checked in 03/2022.  ATA antibodies are also undetectable.  She did have 1 instance of elevated ATA antibodies in the past so we are checking thyroglobulin with a [facility] assay. -We reviewed her neck ultrasound obtained after last visit, on 05/07/2022, which showed no recurrent disease or any other suspicious masses -At today's visit, she has no neck compression symptoms or masses felt on palpation of her neck -Will check her thyroglobulin and ATA antibodies today  Needs refills.  Component     Latest Ref Rng 04/01/2023  TSH     0.35 - 5.50 uIU/mL 0.56   T4,Free(Direct)     0.60 - 1.60 ng/dL 1.61   Thyroglobulin Antibody     0.0 - 0.9 IU/mL <1.0   Thyroglobulin by IMA     1.5 - 38.5 ng/mL <0.1 (L)   All tests are at goal.  Carlus Pavlov, MD PhD [facility] End[facility]in[facility]

## 2023-04-01 NOTE — Patient Instructions (Signed)
Please continue Synthroid 100 mcg daily. ? ?Take the thyroid hormone every day, with water, at least 30 minutes before breakfast, separated by at least 4 hours from: ?- acid reflux medications ?- calcium ?- iron ?- multivitamins ? ?Please stop at the lab. ? ?Please come back for a follow-up appointment in 1 year. ? ? ?

## 2023-04-02 ENCOUNTER — Other Ambulatory Visit: Payer: Self-pay | Admitting: Internal Medicine

## 2023-04-02 DIAGNOSIS — E890 Postprocedural hypothyroidism: Secondary | ICD-10-CM

## 2023-04-02 LAB — SPECIMEN STATUS

## 2023-04-02 LAB — SPECIMEN STATUS REPORT

## 2023-04-02 MED ORDER — LEVOTHYROXINE SODIUM 100 MCG PO TABS: 100.0000 ug | ORAL_TABLET | Freq: Every day | ORAL | 3 refills | Status: DC

## 2023-04-05 LAB — THYROGLOBULIN BY IMA: Thyroglobulin by IMA: <0.1 ng/mL — ABNORMAL LOW (ref 1.5–38.5)

## 2023-04-05 LAB — TGAB+THYROGLOBULIN IMA OR LCMS: Thyroglobulin Antibody: <1 IU/mL (ref 0.0–0.9)

## 2023-05-07 ENCOUNTER — Other Ambulatory Visit: Payer: Self-pay | Admitting: Internal Medicine

## 2023-05-07 DIAGNOSIS — E890 Postprocedural hypothyroidism: Secondary | ICD-10-CM

## 2023-05-10 ENCOUNTER — Encounter: Payer: Self-pay | Admitting: Internal Medicine

## 2023-05-10 DIAGNOSIS — E890 Postprocedural hypothyroidism: Secondary | ICD-10-CM

## 2023-05-11 MED ORDER — LEVOTHYROXINE SODIUM 100 MCG PO TABS
100.0000 ug | ORAL_TABLET | Freq: Every day | ORAL | 3 refills | Status: DC
Start: 2023-05-11 — End: 2024-04-21

## 2023-08-27 IMAGING — US US THYROID
1 series · 14 of 16 positions shown · non-contrast
Comparison: None Available.

CLINICAL DATA: Prior ultrasound follow-up. History of thyroid
cancer, post total thyroidectomy (0220) and postoperative
radioactive iodine ablation.

EXAM:
THYROID ULTRASOUND
TECHNIQUE: Ultrasound examination of the thyroid gland and adjacent soft
tissues was performed.

[Series 1: us thyroid · 0.07mm/px · 14 of 16 slices shown]
[im 1/16]
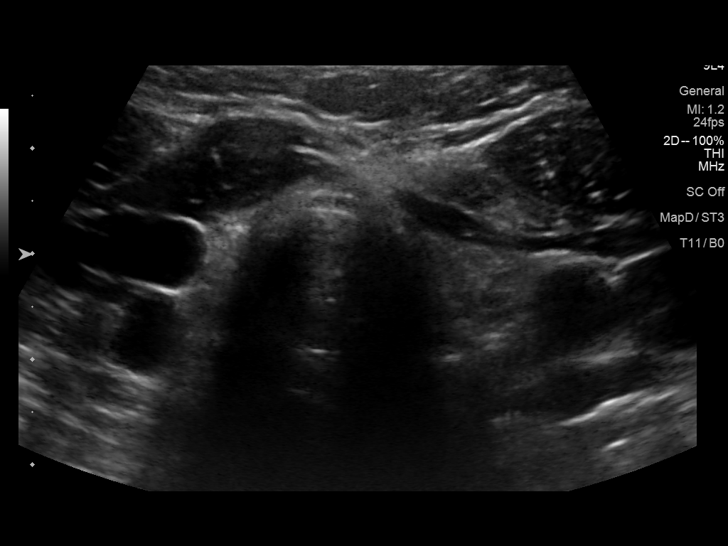
[im 2/16]
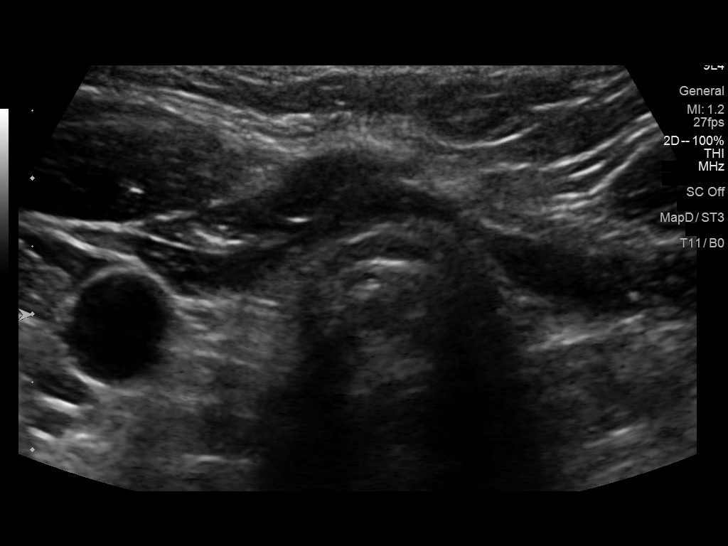
[im 3/16]
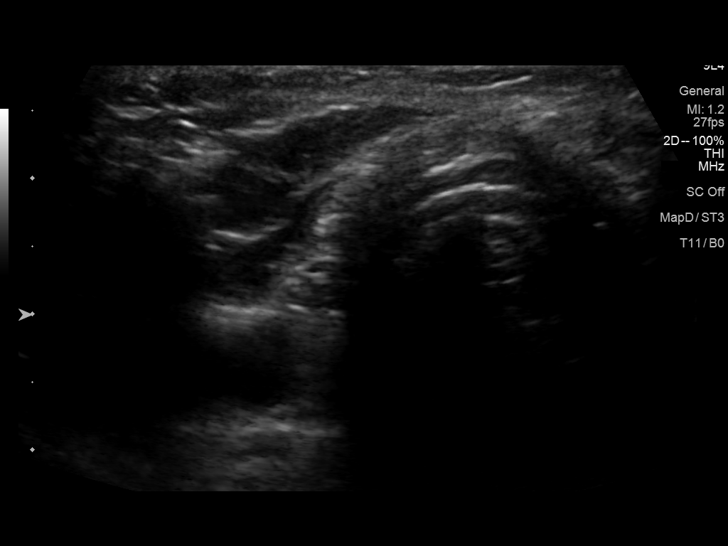
[im 5/16]
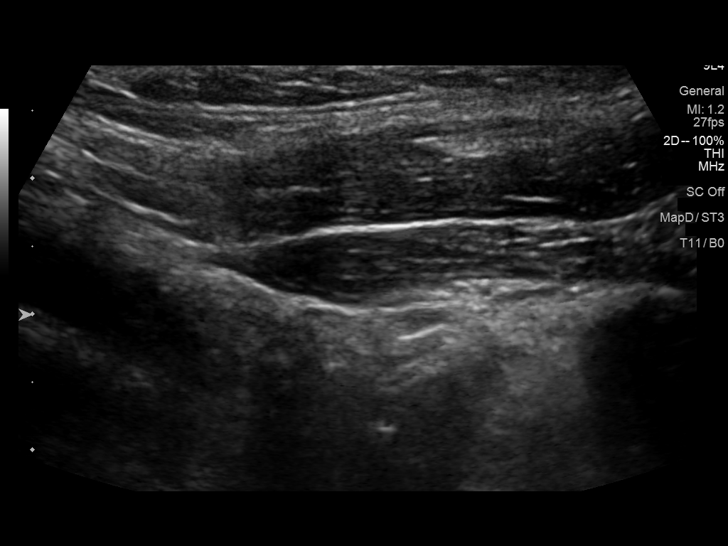
[im 6/16]
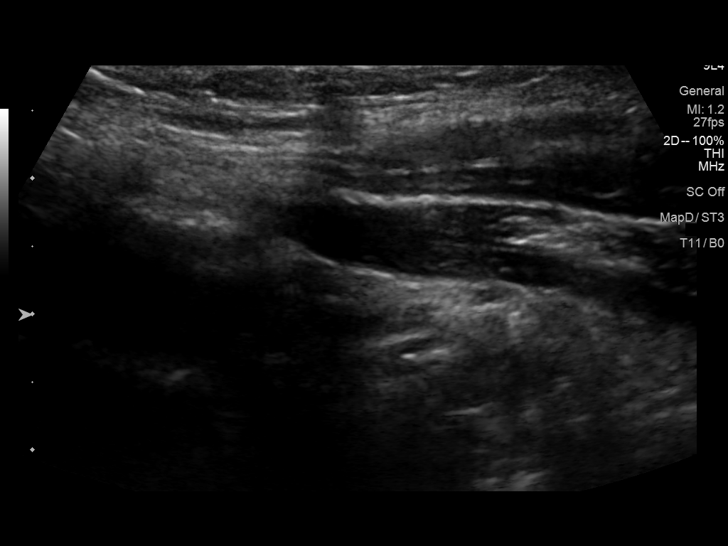
[im 7/16]
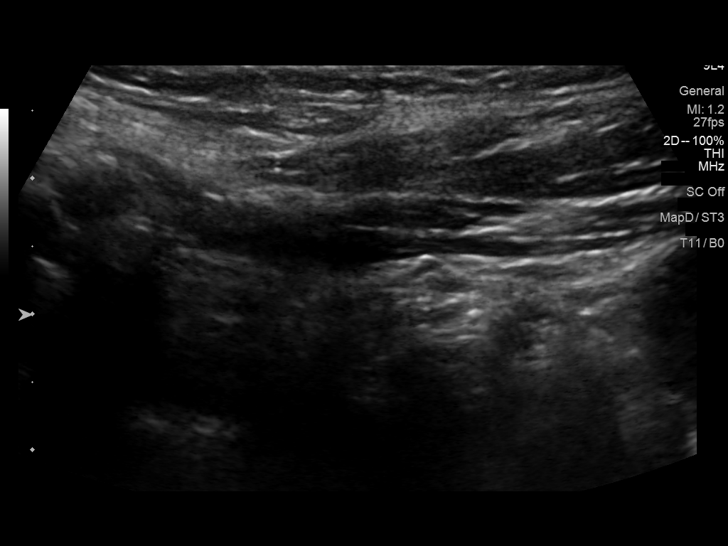
[im 8/16]
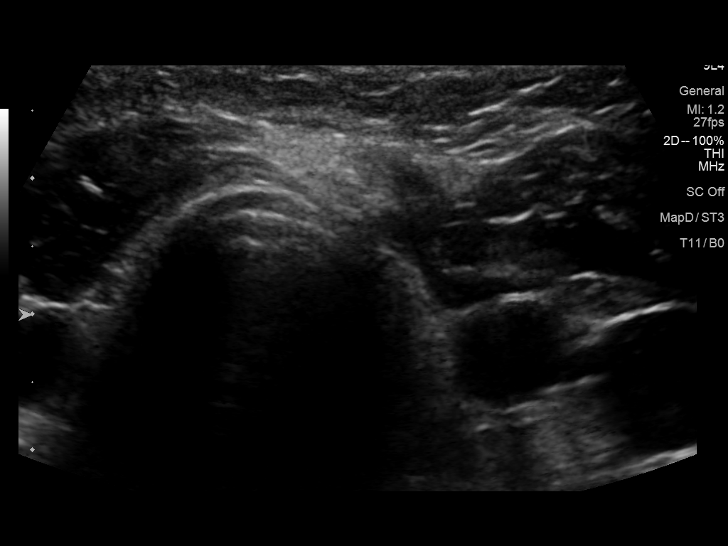
[im 9/16]
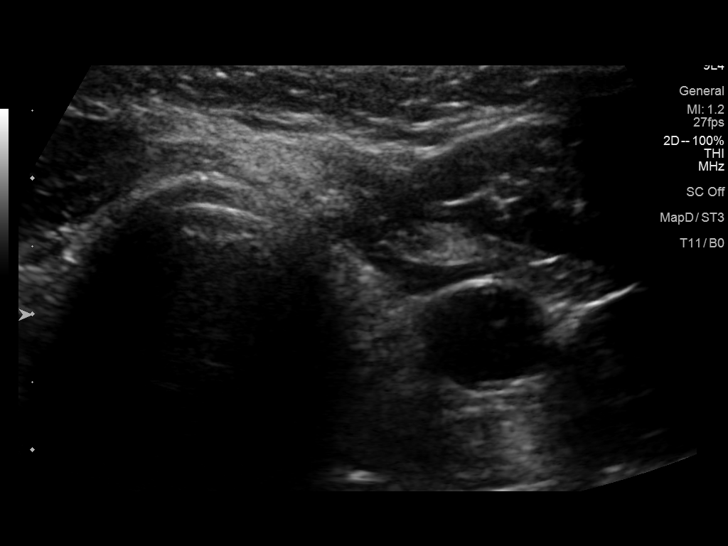
[im 10/16]
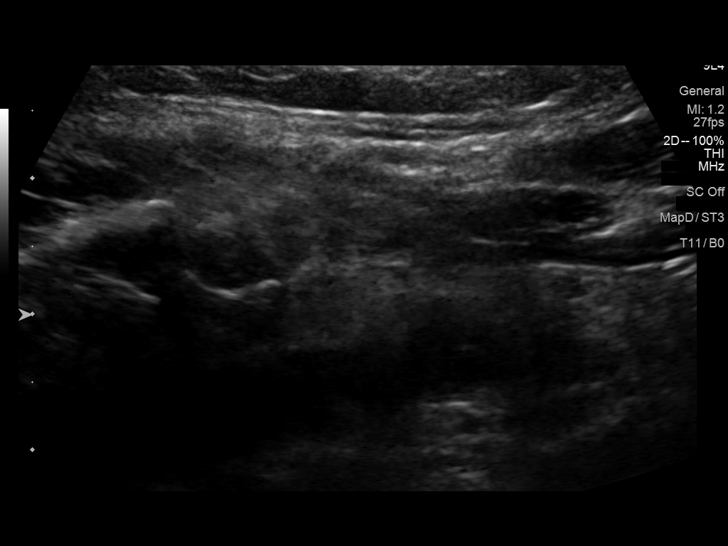
[im 11/16]
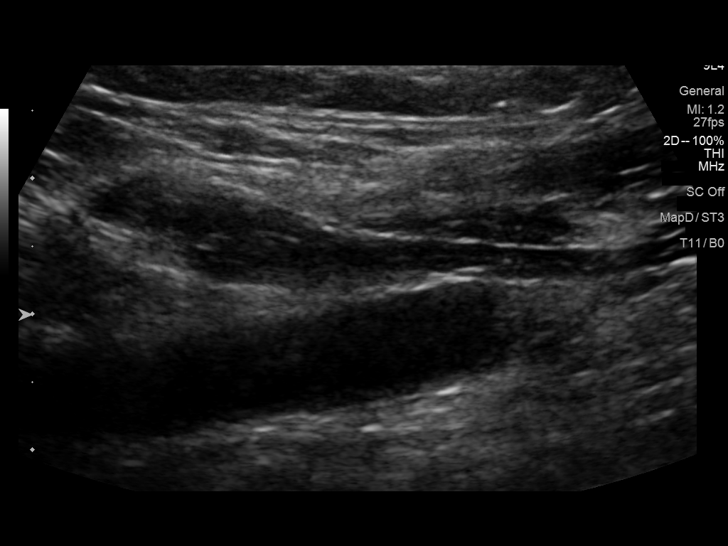
[im 13/16]
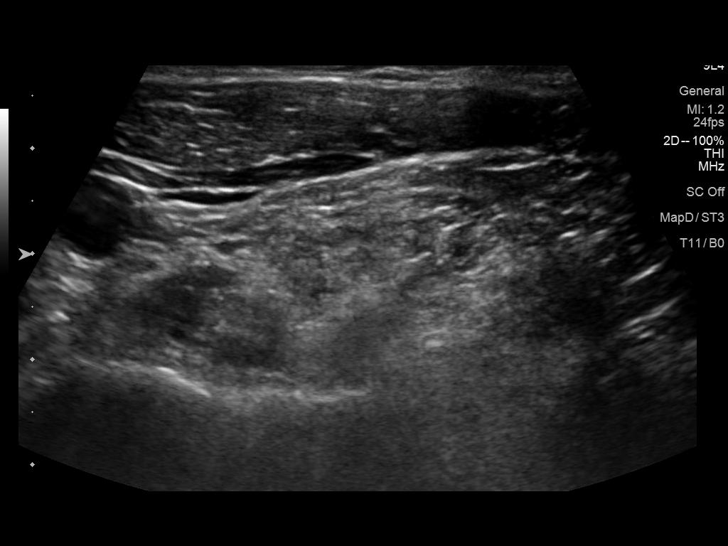
[im 14/16]
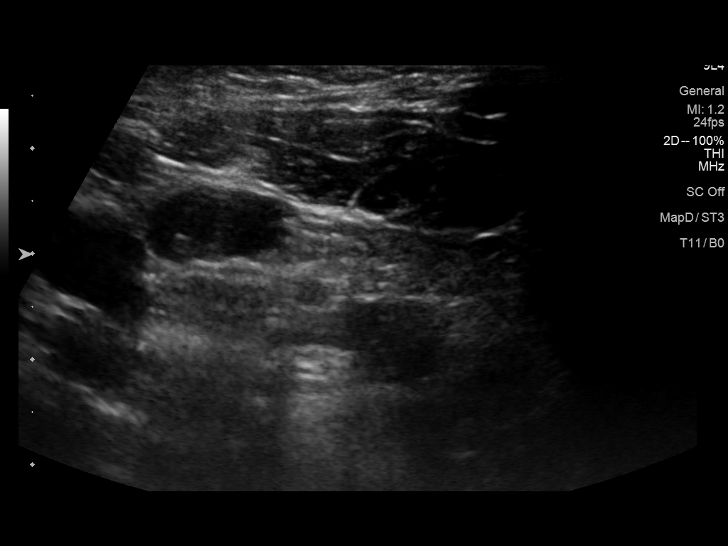
[im 15/16]
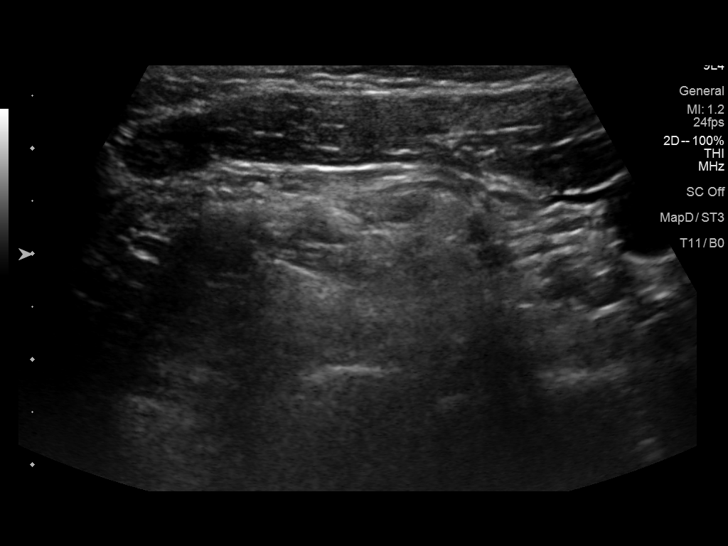
[im 16/16]
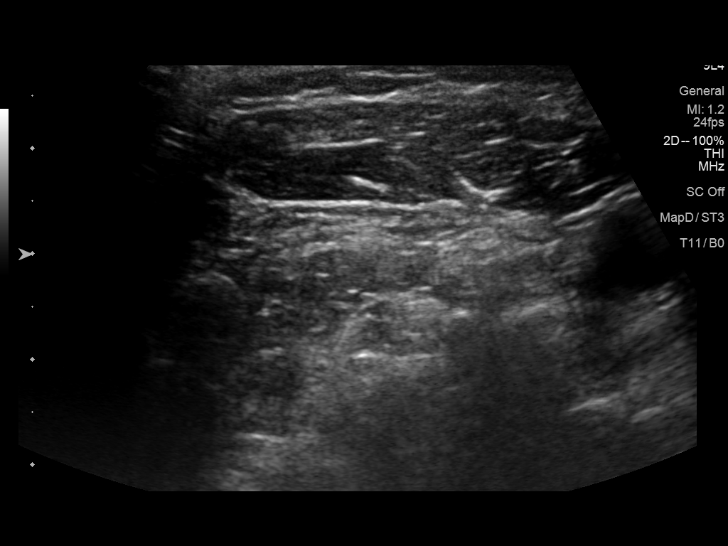

[14 of 16 positions shown; findings below may reference images not displayed]

FINDINGS: Isthmus: Surgically absent. There is no residual nodular soft tissue
within the isthmic resection bed.

Right lobe: Surgically absent. There is no residual nodular soft
tissue within the right lobectomy resection bed.

Left lobe: Surgically absent. There is no residual nodular soft
tissue within the left lobectomy resection bed.

_________________________________________________________

No regional cervical lymphadenopathy.
IMPRESSION: Post total thyroidectomy without evidence of residual or locally
recurrent disease.

## 2023-08-30 ENCOUNTER — Other Ambulatory Visit: Payer: Self-pay | Admitting: Family Medicine

## 2023-08-30 DIAGNOSIS — Z1231 Encounter for screening mammogram for malignant neoplasm of breast: Secondary | ICD-10-CM

## 2023-10-19 ENCOUNTER — Ambulatory Visit
Admission: RE | Admit: 2023-10-19 | Discharge: 2023-10-19 | Disposition: A | Discharge status: Home / Self Care | Payer: 59 | Source: Ambulatory Visit | Attending: Family Medicine | Admitting: Family Medicine

## 2023-10-19 DIAGNOSIS — Z1231 Encounter for screening mammogram for malignant neoplasm of breast: Secondary | ICD-10-CM

## 2024-04-19 ENCOUNTER — Encounter: Payer: Self-pay | Admitting: Internal Medicine

## 2024-04-19 ENCOUNTER — Ambulatory Visit: Payer: 59 | Admitting: Internal Medicine

## 2024-04-19 VITALS — BP 120/70 | HR 51 | Ht 65.5 in | Wt 229.2 lb

## 2024-04-19 DIAGNOSIS — C73 Malignant neoplasm of thyroid gland: Secondary | ICD-10-CM

## 2024-04-19 DIAGNOSIS — E890 Postprocedural hypothyroidism: Secondary | ICD-10-CM

## 2024-04-19 NOTE — Progress Notes (Signed)
 Patient ID: Shelly Baldwin, female   DOB: 1966/01/26, 58 y.o.   MRN: [id number]  HPI  Shelly Baldwin is a 58 y.o.-year-old female, returning for f/u for thyroid  cancer and postsurgical hypothyroidism. Last visit 1 year ago.  Interim history: She feels well at today's visit, without complaints.    Reviewed history: Pt. has been found to have a goiter with neck compression symptoms sxs, then had Bx >> inconclusive >> total thyroidectomy >> dx with thyroid  cancer in 2004, had total thyroidectomy in 2005.  She remembers having had RAI treatment and a whole-body scan posttreatment. She had rTSH - stim WBS every year for 5 years >> no mets (up to 2010).  Per insurance preference, she had to switch from Synthroid  to Levoxyl  >> abdominal pain, did not feel well.  We then switched back to Synthroid  but she needs to obtain it from the mail order pharmacy.  Thyroid  U/S (05/07/2022): Post total thyroidectomy without evidence of residual or locally recurrent disease.  She is on Synthroid  d.a.w. 100 mcg daily (dose changed 12/2018): - in am - fasting - at least 60 min from b'fast - no Ca, no PPI - + Fe >4h after LT4 - + MVI 4h later  - not on Biotin On B12, D, E, stopped omega 3 FAs.  Reviewed TFTs: Lab Results  Component Value Date   TSH 0.56 04/01/2023   TSH 1.29 03/25/2021   TSH 2.79 01/12/2020   TSH 0.15 (L) 01/11/2019   TSH 0.65 03/14/2018   TSH 0.30 (L) 01/11/2018   TSH 1.65 03/24/2017   TSH 0.17 (L) 01/11/2017   TSH 2.26 11/29/2015   TSH 0.42 11/26/2014   FREET4 1.13 04/01/2023   FREET4 1.14 03/25/2021   FREET4 0.98 01/12/2020   FREET4 1.22 01/11/2019   FREET4 0.96 03/14/2018   FREET4 0.96 01/11/2018   FREET4 1.04 01/11/2017   FREET4 0.86 11/29/2015   FREET4 0.95 11/26/2014   FREET4 1.59 09/26/2014     02/26/22 08/28/21  TSH 1.79  1.42    Her thyroglobulin levels remain undetectable: Lab Results  Component Value Date   THYROGLB <0.1 (L) 04/01/2023   THYROGLB <0.1 (L)  03/26/2022   THYROGLB <0.1 (L) 03/25/2021   THYROGLB <0.1 (L) 01/12/2020   THYROGLB <0.1 (L) 01/11/2019   THYROGLB <0.1 (L) 01/11/2018   THYROGLB <0.1 (L) 01/11/2017   THYROGLB <2.0 11/29/2015   THYROGLB <2.0 09/26/2014   THYROGLB <0.2 11/24/2013   She had 1 instance of elevated ATA antibodies: Lab Results  Component Value Date   THGAB <1.0 04/01/2023   THGAB <1.0 03/26/2022   THGAB <1.0 03/25/2021   THGAB <1.0 01/12/2020   THGAB <1.0 01/11/2019   THGAB <1.0 01/11/2018   THGAB <1.0 01/11/2017   THGAB <1.0 11/29/2015   THGAB <1.0 09/26/2014   THGAB 485.0 (H) 11/24/2013   Pt denies: - feeling nodules in neck - hoarseness - dysphagia - choking  Her mother had thyroid  surgery >> now hypothyroid.   She also has HTN, HL. She has a history of episodic BPPV.  She had a slightly elevated calcium level in 02/2023, at 10.7, but an albumin level was 4.6 and the corrected calcium was normal, at 10.2.  ROS: + see HPI  I reviewed pt's medications, allergies, PMH, social hx, family hx, and changes were documented in the history of present illness. Otherwise, unchanged from my initial visit note.  Past Medical History:  Diagnosis Date   Cancer (HCC)    undifferentiated thryoid   Thyroid   disease    Past Surgical History:  Procedure Laterality Date   G0P0     THYROIDECTOMY     2005   History   Social History   Marital Status: Single    Spouse Name: N/A    Number of Children: 0   Years of Education: 14   Occupational History   technical support    Social History Main Topics   Smoking status: Former Smoker    Quit date: 08/13/1986   Smokeless tobacco: Never Used   Alcohol Use: No   Drug Use: No   Social History Narrative   HSG, [employer] in [place], matriculating [employer] (Sept '12). Single. Work - Biochemist, clinical at [employer]. \Do you exercise at least 3 times a week- twice a week with walking. Is there a smoke alarm in your house- yes. Are there guns and  firearms in your household- no . Have you experienced physical abuse-no         Usual # of hours of sleep per night 6-7 hours per night   Number of people living at your residence- Pt lives alone   Current Outpatient Medications on File Prior to Visit  Medication Sig Dispense Refill   amLODipine  (NORVASC ) 5 MG tablet TAKE 1 TABLET BY MOUTH EVERY DAY 90 tablet 1   fluticasone  (FLONASE ) 50 MCG/ACT nasal spray Place into both nostrils daily.     hydrochlorothiazide  (HYDRODIURIL ) 25 MG tablet TAKE 1 TABLET BY MOUTH EVERY DAY 90 tablet 1   KLOR-CON  M20 20 MEQ tablet TAKE 2 TABLETS BY MOUTH DAILY 180 tablet 0   levothyroxine  (SYNTHROID ) 100 MCG tablet Take 1 tablet (100 mcg total) by mouth daily before breakfast. 90 tablet 3   Loratadine  (CLARITIN  PO) Take by mouth daily.     rosuvastatin (CRESTOR) 10 MG tablet Take 10 mg by mouth daily.     No current facility-administered medications on file prior to visit.   No Known Allergies Family History  Problem Relation Age of Onset   Breast cancer Mother 7   Hypertension Father    Diabetes Father    Hypothyroidism Father    Prostate cancer Father    PE: BP 120/70   Pulse (!) 51   Ht 5' 5.5" (1.664 m)   Wt 229 lb 3.2 oz (104 kg)   LMP 10/11/2017   SpO2 98%   BMI 37.56 kg/m   Wt Readings from Last 3 Encounters:  04/19/24 229 lb 3.2 oz (104 kg)  04/01/23 227 lb 9.6 oz (103.2 kg)  03/26/22 223 lb (101.2 kg)   Constitutional: overweight, in NAD Eyes: EOMI, no exophthalmos ENT: no neck masses palpated, thyroidectomy scar healed, no cervical lymphadenopathy Cardiovascular: RRR, No MRG Respiratory: CTA B Musculoskeletal: no deformities Skin: no rashes Neurological: no tremor with outstretched hands  ASSESSMENT: 1. Postsurgical Hypothyroidism - on Brand name Synthroid   2. Thyroid  Cancer (stage 1 or 2) in remission  PLAN:  1. Patient with longstanding, postsurgical, hypothyroidism, developed after total thyroidectomy for thyroid   cancer - latest thyroid  labs reviewed with pt. >> normal: Lab Results  Component Value Date   TSH 0.56 04/01/2023  - she continues on LT4  100 mcg daily - pt feels good on this dose, without complaints. - we discussed about taking the thyroid  hormone every day, with water, >30 minutes before breakfast, separated by >4 hours from acid reflux medications, calcium, iron, multivitamins. Pt. is taking it correctly. - will check thyroid  tests today: TSH and fT4 - If labs are  abnormal, she will need to return for repeat TFTs in 1.5 months  2. PTC - In remission - Her whole-body scans were negative for recurrences of metastasis after 2010 - She can need to have an undetectable thyroglobulin with the latest level checked in 03/2023.  ATA antibodies are also undetectable.  She did have 1 instance of elevated ATA antibodies in the past so we were checking thyroglobulin with a [facility] assay.  However, we now have [facility] as the only provider in the office so we will switch to this.  Will check thyroglobulin and ATA antibodies again today - We reviewed her neck ultrasound obtained in 04/2023: No recurrent disease or other suspicious masses in her neck - she has no neck compression symptoms or masses palpable in her neck  Needs refills.  Component     Latest Ref Rng 04/19/2024  T4,Free(Direct)     0.8 - 1.8 ng/dL 1.6   TSH     9.52 - 8.41 mIU/L 1.22   Thyroglobulin Ab     < or = 1 IU/mL <1   Thyroglobulin     ng/mL <0.1 (L)   Comment --   All tests are at goal.  Emilie Harden, MD PhD [facility] Endocrinology

## 2024-04-19 NOTE — Patient Instructions (Signed)
Please continue Synthroid 100 mcg daily. ? ?Take the thyroid hormone every day, with water, at least 30 minutes before breakfast, separated by at least 4 hours from: ?- acid reflux medications ?- calcium ?- iron ?- multivitamins ? ?Please stop at the lab. ? ?Please come back for a follow-up appointment in 1 year. ? ? ?

## 2024-04-20 LAB — TSH: TSH: 1.22 m[IU]/L (ref 0.40–4.50)

## 2024-04-20 LAB — THYROGLOBULIN LEVEL: Thyroglobulin: <0.1 ng/mL — ABNORMAL LOW

## 2024-04-20 LAB — THYROGLOBULIN ANTIBODY: Thyroglobulin Ab: <1 [IU]/mL (ref <=1)

## 2024-04-20 LAB — T4, FREE: Free T4: 1.6 ng/dL (ref 0.8–1.8)

## 2024-04-21 ENCOUNTER — Ambulatory Visit: Payer: Self-pay | Admitting: Internal Medicine

## 2024-04-21 DIAGNOSIS — E890 Postprocedural hypothyroidism: Secondary | ICD-10-CM

## 2024-04-21 MED ORDER — LEVOTHYROXINE SODIUM 100 MCG PO TABS: 100.0000 ug | ORAL_TABLET | Freq: Every day | ORAL | 3 refills | Status: DC

## 2024-05-01 ENCOUNTER — Other Ambulatory Visit: Payer: Self-pay | Admitting: Internal Medicine

## 2024-05-01 DIAGNOSIS — E890 Postprocedural hypothyroidism: Secondary | ICD-10-CM

## 2024-06-01 ENCOUNTER — Telehealth: Payer: Self-pay

## 2024-06-01 NOTE — Telephone Encounter (Signed)
 Pt called and left a VM stating she was dx'd gout in her L foot and they prescribed Prednisone for her. She wants to know does she need to wait 4 hours after taking it her Synthroid ?

## 2024-08-17 ENCOUNTER — Other Ambulatory Visit: Payer: Self-pay | Admitting: Family Medicine

## 2024-08-17 DIAGNOSIS — Z1231 Encounter for screening mammogram for malignant neoplasm of breast: Secondary | ICD-10-CM

## 2024-10-19 ENCOUNTER — Ambulatory Visit

## 2024-10-19 ENCOUNTER — Ambulatory Visit
Admission: RE | Admit: 2024-10-19 | Discharge: 2024-10-19 | Disposition: A | Source: Ambulatory Visit | Attending: Family Medicine | Admitting: Family Medicine

## 2024-10-19 DIAGNOSIS — Z1231 Encounter for screening mammogram for malignant neoplasm of breast: Secondary | ICD-10-CM

## 2025-04-19 ENCOUNTER — Ambulatory Visit: Admitting: Internal Medicine
